# Patient Record
Sex: Female | Born: 1975 | Race: White | Hispanic: No | Marital: Single | Smoking: Current every day smoker
Health system: Southern US, Community
[De-identification: ages and names within clinical notes are randomized; demographics above are authoritative.]

## PROBLEM LIST (undated history)

## (undated) ENCOUNTER — Ambulatory Visit (HOSPITAL_COMMUNITY): Discharge status: Absent without leave | Payer: Medicaid Other

## (undated) DIAGNOSIS — Z789 Other specified health status: Secondary | ICD-10-CM

## (undated) DIAGNOSIS — F419 Anxiety disorder, unspecified: Secondary | ICD-10-CM

## (undated) DIAGNOSIS — T7840XA Allergy, unspecified, initial encounter: Secondary | ICD-10-CM

## (undated) HISTORY — PX: WISDOM TOOTH EXTRACTION: SHX21

## (undated) HISTORY — DX: Allergy, unspecified, initial encounter: T78.40XA

---

## 1998-07-03 ENCOUNTER — Other Ambulatory Visit: Admission: RE | Admit: 1998-07-03 | Discharge: 1998-07-03 | Payer: Self-pay | Admitting: Obstetrics

## 1998-07-05 ENCOUNTER — Ambulatory Visit (HOSPITAL_COMMUNITY): Admission: RE | Admit: 1998-07-05 | Discharge: 1998-07-05 | Payer: Self-pay | Admitting: Obstetrics

## 1998-10-08 ENCOUNTER — Ambulatory Visit (HOSPITAL_COMMUNITY): Admission: RE | Admit: 1998-10-08 | Discharge: 1998-10-08 | Payer: Self-pay | Admitting: Obstetrics

## 1998-11-04 ENCOUNTER — Ambulatory Visit (HOSPITAL_COMMUNITY): Admission: RE | Admit: 1998-11-04 | Discharge: 1998-11-04 | Payer: Self-pay | Admitting: Obstetrics

## 1999-01-26 ENCOUNTER — Inpatient Hospital Stay (HOSPITAL_COMMUNITY): Admission: AD | Admit: 1999-01-26 | Discharge: 1999-01-26 | Payer: Self-pay | Admitting: Obstetrics

## 1999-01-28 ENCOUNTER — Inpatient Hospital Stay (HOSPITAL_COMMUNITY): Admission: AD | Admit: 1999-01-28 | Discharge: 1999-01-28 | Payer: Self-pay | Admitting: Obstetrics

## 1999-01-30 ENCOUNTER — Inpatient Hospital Stay (HOSPITAL_COMMUNITY): Admission: AD | Admit: 1999-01-30 | Discharge: 1999-02-01 | Payer: Self-pay | Admitting: Obstetrics

## 2001-04-23 ENCOUNTER — Emergency Department (HOSPITAL_COMMUNITY): Admission: EM | Admit: 2001-04-23 | Discharge: 2001-04-23 | Payer: Self-pay | Admitting: Emergency Medicine

## 2001-11-03 ENCOUNTER — Emergency Department (HOSPITAL_COMMUNITY): Admission: EM | Admit: 2001-11-03 | Discharge: 2001-11-03 | Payer: Self-pay | Admitting: Emergency Medicine

## 2002-03-10 ENCOUNTER — Emergency Department (HOSPITAL_COMMUNITY): Admission: EM | Admit: 2002-03-10 | Discharge: 2002-03-10 | Payer: Self-pay | Admitting: *Deleted

## 2002-03-28 ENCOUNTER — Encounter: Payer: Self-pay | Admitting: *Deleted

## 2002-03-28 ENCOUNTER — Emergency Department (HOSPITAL_COMMUNITY): Admission: EM | Admit: 2002-03-28 | Discharge: 2002-03-28 | Payer: Self-pay | Admitting: *Deleted

## 2002-07-15 ENCOUNTER — Emergency Department (HOSPITAL_COMMUNITY): Admission: EM | Admit: 2002-07-15 | Discharge: 2002-07-15 | Payer: Self-pay | Admitting: *Deleted

## 2002-09-09 ENCOUNTER — Emergency Department (HOSPITAL_COMMUNITY): Admission: EM | Admit: 2002-09-09 | Discharge: 2002-09-09 | Payer: Self-pay | Admitting: *Deleted

## 2002-09-11 ENCOUNTER — Emergency Department (HOSPITAL_COMMUNITY): Admission: EM | Admit: 2002-09-11 | Discharge: 2002-09-11 | Payer: Self-pay | Admitting: Emergency Medicine

## 2002-11-14 ENCOUNTER — Emergency Department (HOSPITAL_COMMUNITY): Admission: EM | Admit: 2002-11-14 | Discharge: 2002-11-14 | Payer: Self-pay | Admitting: Emergency Medicine

## 2003-04-26 ENCOUNTER — Emergency Department (HOSPITAL_COMMUNITY): Admission: EM | Admit: 2003-04-26 | Discharge: 2003-04-26 | Payer: Self-pay | Admitting: Emergency Medicine

## 2003-05-06 ENCOUNTER — Emergency Department (HOSPITAL_COMMUNITY): Admission: EM | Admit: 2003-05-06 | Discharge: 2003-05-06 | Payer: Self-pay | Admitting: Emergency Medicine

## 2004-06-03 ENCOUNTER — Emergency Department (HOSPITAL_COMMUNITY): Admission: EM | Admit: 2004-06-03 | Discharge: 2004-06-03 | Payer: Self-pay | Admitting: Family Medicine

## 2004-06-12 ENCOUNTER — Emergency Department (HOSPITAL_COMMUNITY): Admission: EM | Admit: 2004-06-12 | Discharge: 2004-06-13 | Payer: Self-pay | Admitting: Emergency Medicine

## 2004-06-15 ENCOUNTER — Emergency Department (HOSPITAL_COMMUNITY): Admission: EM | Admit: 2004-06-15 | Discharge: 2004-06-15 | Payer: Self-pay | Admitting: Family Medicine

## 2004-06-17 ENCOUNTER — Emergency Department (HOSPITAL_COMMUNITY): Admission: EM | Admit: 2004-06-17 | Discharge: 2004-06-17 | Payer: Self-pay

## 2004-07-29 ENCOUNTER — Other Ambulatory Visit: Admission: RE | Admit: 2004-07-29 | Discharge: 2004-07-29 | Payer: Self-pay | Admitting: Internal Medicine

## 2004-07-29 ENCOUNTER — Ambulatory Visit (HOSPITAL_COMMUNITY): Admission: RE | Admit: 2004-07-29 | Discharge: 2004-07-29 | Payer: Self-pay | Admitting: Internal Medicine

## 2004-08-04 ENCOUNTER — Ambulatory Visit (HOSPITAL_COMMUNITY): Admission: RE | Admit: 2004-08-04 | Discharge: 2004-08-04 | Payer: Self-pay | Admitting: Internal Medicine

## 2004-10-08 ENCOUNTER — Ambulatory Visit: Payer: Self-pay | Admitting: Internal Medicine

## 2005-01-19 ENCOUNTER — Ambulatory Visit: Payer: Self-pay | Admitting: Internal Medicine

## 2005-01-21 ENCOUNTER — Ambulatory Visit: Payer: Self-pay | Admitting: Internal Medicine

## 2005-03-22 ENCOUNTER — Ambulatory Visit: Payer: Self-pay | Admitting: Internal Medicine

## 2005-07-27 ENCOUNTER — Inpatient Hospital Stay (HOSPITAL_COMMUNITY): Admission: AD | Admit: 2005-07-27 | Discharge: 2005-07-27 | Payer: Self-pay | Admitting: *Deleted

## 2005-08-09 ENCOUNTER — Ambulatory Visit: Payer: Self-pay | Admitting: Internal Medicine

## 2005-08-09 ENCOUNTER — Other Ambulatory Visit: Admission: RE | Admit: 2005-08-09 | Discharge: 2005-08-09 | Payer: Self-pay | Admitting: Internal Medicine

## 2005-08-09 LAB — CONVERTED CEMR LAB: Pap Smear: NORMAL

## 2005-12-24 ENCOUNTER — Emergency Department (HOSPITAL_COMMUNITY): Admission: EM | Admit: 2005-12-24 | Discharge: 2005-12-24 | Payer: Self-pay | Admitting: Family Medicine

## 2006-02-21 ENCOUNTER — Ambulatory Visit: Payer: Self-pay | Admitting: Internal Medicine

## 2006-03-08 ENCOUNTER — Ambulatory Visit: Payer: Self-pay | Admitting: Internal Medicine

## 2006-03-09 ENCOUNTER — Ambulatory Visit: Payer: Self-pay | Admitting: Internal Medicine

## 2006-04-05 ENCOUNTER — Ambulatory Visit: Payer: Self-pay | Admitting: Internal Medicine

## 2006-06-03 ENCOUNTER — Ambulatory Visit: Payer: Self-pay | Admitting: Family Medicine

## 2006-06-06 ENCOUNTER — Ambulatory Visit: Payer: Self-pay | Admitting: Family Medicine

## 2006-07-01 ENCOUNTER — Ambulatory Visit: Payer: Self-pay | Admitting: Internal Medicine

## 2006-08-09 ENCOUNTER — Other Ambulatory Visit: Admission: RE | Admit: 2006-08-09 | Discharge: 2006-08-09 | Payer: Self-pay | Admitting: Internal Medicine

## 2006-08-09 ENCOUNTER — Encounter: Payer: Self-pay | Admitting: Internal Medicine

## 2006-08-09 ENCOUNTER — Ambulatory Visit: Payer: Self-pay | Admitting: Internal Medicine

## 2006-08-09 LAB — CONVERTED CEMR LAB: Pap Smear: NORMAL

## 2006-11-03 ENCOUNTER — Ambulatory Visit: Payer: Self-pay | Admitting: Family Medicine

## 2007-01-06 ENCOUNTER — Ambulatory Visit: Payer: Self-pay | Admitting: Internal Medicine

## 2007-04-10 ENCOUNTER — Emergency Department (HOSPITAL_COMMUNITY): Admission: EM | Admit: 2007-04-10 | Discharge: 2007-04-10 | Payer: Self-pay | Admitting: Emergency Medicine

## 2007-08-09 ENCOUNTER — Encounter: Payer: Self-pay | Admitting: Internal Medicine

## 2007-08-09 DIAGNOSIS — L2089 Other atopic dermatitis: Secondary | ICD-10-CM

## 2007-08-09 DIAGNOSIS — F329 Major depressive disorder, single episode, unspecified: Secondary | ICD-10-CM

## 2007-08-09 DIAGNOSIS — J309 Allergic rhinitis, unspecified: Secondary | ICD-10-CM | POA: Insufficient documentation

## 2007-08-09 DIAGNOSIS — E669 Obesity, unspecified: Secondary | ICD-10-CM

## 2007-08-09 DIAGNOSIS — F419 Anxiety disorder, unspecified: Secondary | ICD-10-CM | POA: Insufficient documentation

## 2007-08-09 DIAGNOSIS — F17200 Nicotine dependence, unspecified, uncomplicated: Secondary | ICD-10-CM

## 2007-08-09 DIAGNOSIS — D239 Other benign neoplasm of skin, unspecified: Secondary | ICD-10-CM | POA: Insufficient documentation

## 2007-08-09 DIAGNOSIS — F411 Generalized anxiety disorder: Secondary | ICD-10-CM | POA: Insufficient documentation

## 2007-08-22 ENCOUNTER — Other Ambulatory Visit: Admission: RE | Admit: 2007-08-22 | Discharge: 2007-08-22 | Payer: Self-pay | Admitting: Family Medicine

## 2007-08-22 ENCOUNTER — Ambulatory Visit: Payer: Self-pay | Admitting: Family Medicine

## 2007-08-22 ENCOUNTER — Encounter (INDEPENDENT_AMBULATORY_CARE_PROVIDER_SITE_OTHER): Payer: Self-pay | Admitting: Family Medicine

## 2007-08-22 LAB — CONVERTED CEMR LAB
ALT: 9 units/L (ref 0–35)
Alkaline Phosphatase: 59 units/L (ref 39–117)
Basophils Absolute: 0 10*3/uL (ref 0.0–0.1)
CO2: 21 meq/L (ref 19–32)
Eosinophils Absolute: 0.1 10*3/uL (ref 0.0–0.7)
Eosinophils Relative: 1 % (ref 0–5)
HCT: 39.8 % (ref 36.0–46.0)
Hep A Total Ab: NEGATIVE
Hep B Core Total Ab: NEGATIVE
Hep B E Ab: NEGATIVE
Hep B S Ab: NEGATIVE
LDL Cholesterol: 106 mg/dL — ABNORMAL HIGH (ref 0–99)
Lymphocytes Relative: 19 % (ref 12–46)
Neutrophils Relative %: 75 % (ref 43–77)
Platelets: 326 10*3/uL (ref 150–400)
Potassium: 4.1 meq/L (ref 3.5–5.3)
RDW: 13.8 % (ref 11.5–14.0)
Sodium: 141 meq/L (ref 135–145)
Total Bilirubin: 0.3 mg/dL (ref 0.3–1.2)
Total Protein: 7.1 g/dL (ref 6.0–8.3)
VLDL: 23 mg/dL (ref 0–40)

## 2007-10-29 ENCOUNTER — Encounter (INDEPENDENT_AMBULATORY_CARE_PROVIDER_SITE_OTHER): Payer: Self-pay | Admitting: Family Medicine

## 2007-12-05 ENCOUNTER — Ambulatory Visit: Payer: Self-pay | Admitting: Internal Medicine

## 2007-12-07 ENCOUNTER — Ambulatory Visit: Payer: Self-pay | Admitting: Family Medicine

## 2007-12-09 ENCOUNTER — Emergency Department (HOSPITAL_COMMUNITY): Admission: EM | Admit: 2007-12-09 | Discharge: 2007-12-09 | Payer: Self-pay | Admitting: *Deleted

## 2007-12-27 ENCOUNTER — Ambulatory Visit: Payer: Self-pay | Admitting: Internal Medicine

## 2008-02-15 ENCOUNTER — Emergency Department (HOSPITAL_COMMUNITY): Admission: EM | Admit: 2008-02-15 | Discharge: 2008-02-15 | Payer: Self-pay | Admitting: Family Medicine

## 2008-02-17 ENCOUNTER — Emergency Department (HOSPITAL_COMMUNITY): Admission: EM | Admit: 2008-02-17 | Discharge: 2008-02-17 | Payer: Self-pay | Admitting: Emergency Medicine

## 2008-06-23 ENCOUNTER — Emergency Department (HOSPITAL_COMMUNITY): Admission: EM | Admit: 2008-06-23 | Discharge: 2008-06-23 | Payer: Self-pay | Admitting: Emergency Medicine

## 2008-08-14 ENCOUNTER — Emergency Department (HOSPITAL_COMMUNITY): Admission: EM | Admit: 2008-08-14 | Discharge: 2008-08-14 | Payer: Self-pay | Admitting: Emergency Medicine

## 2008-08-18 ENCOUNTER — Emergency Department (HOSPITAL_COMMUNITY): Admission: EM | Admit: 2008-08-18 | Discharge: 2008-08-18 | Payer: Self-pay | Admitting: Emergency Medicine

## 2008-12-10 ENCOUNTER — Inpatient Hospital Stay (HOSPITAL_COMMUNITY): Admission: AD | Admit: 2008-12-10 | Discharge: 2008-12-10 | Payer: Self-pay | Admitting: Obstetrics & Gynecology

## 2009-08-21 ENCOUNTER — Emergency Department (HOSPITAL_COMMUNITY): Admission: EM | Admit: 2009-08-21 | Discharge: 2009-08-21 | Payer: Self-pay | Admitting: Emergency Medicine

## 2010-02-26 ENCOUNTER — Emergency Department (HOSPITAL_COMMUNITY): Admission: EM | Admit: 2010-02-26 | Discharge: 2010-02-26 | Payer: Self-pay | Admitting: Emergency Medicine

## 2010-09-19 ENCOUNTER — Emergency Department (HOSPITAL_COMMUNITY): Admission: EM | Admit: 2010-09-19 | Discharge: 2010-09-19 | Payer: Self-pay | Admitting: Emergency Medicine

## 2010-09-20 ENCOUNTER — Emergency Department (HOSPITAL_COMMUNITY): Admission: EM | Admit: 2010-09-20 | Discharge: 2010-09-20 | Payer: Self-pay | Admitting: Family Medicine

## 2011-09-10 LAB — POCT RAPID STREP A: Streptococcus, Group A Screen (Direct): NEGATIVE

## 2011-09-24 LAB — POCT PREGNANCY, URINE: Preg Test, Ur: NEGATIVE

## 2011-09-24 LAB — WET PREP, GENITAL: Yeast Wet Prep HPF POC: NONE SEEN

## 2011-09-24 LAB — URINALYSIS, ROUTINE W REFLEX MICROSCOPIC
Bilirubin Urine: NEGATIVE
Glucose, UA: NEGATIVE mg/dL
Hgb urine dipstick: NEGATIVE
Ketones, ur: NEGATIVE mg/dL
pH: 7 (ref 5.0–8.0)

## 2011-09-24 LAB — GC/CHLAMYDIA PROBE AMP, GENITAL: Chlamydia, DNA Probe: NEGATIVE

## 2011-10-24 ENCOUNTER — Encounter (HOSPITAL_COMMUNITY): Payer: Self-pay

## 2011-10-24 ENCOUNTER — Emergency Department (HOSPITAL_COMMUNITY)
Admission: EM | Admit: 2011-10-24 | Discharge: 2011-10-24 | Disposition: A | Discharge status: Home / Self Care | Payer: Medicaid Other | Attending: Emergency Medicine | Admitting: Emergency Medicine

## 2011-10-24 ENCOUNTER — Emergency Department (HOSPITAL_COMMUNITY)
Admission: EM | Admit: 2011-10-24 | Discharge: 2011-10-24 | Discharge status: Against Medical Advice | Payer: Medicaid Other | Source: Home / Self Care | Attending: Emergency Medicine | Admitting: Emergency Medicine

## 2011-10-24 ENCOUNTER — Encounter: Payer: Self-pay | Admitting: *Deleted

## 2011-10-24 DIAGNOSIS — N898 Other specified noninflammatory disorders of vagina: Secondary | ICD-10-CM

## 2011-10-24 DIAGNOSIS — R630 Anorexia: Secondary | ICD-10-CM | POA: Insufficient documentation

## 2011-10-24 DIAGNOSIS — N390 Urinary tract infection, site not specified: Secondary | ICD-10-CM

## 2011-10-24 DIAGNOSIS — F17200 Nicotine dependence, unspecified, uncomplicated: Secondary | ICD-10-CM | POA: Insufficient documentation

## 2011-10-24 DIAGNOSIS — R51 Headache: Secondary | ICD-10-CM | POA: Insufficient documentation

## 2011-10-24 DIAGNOSIS — R300 Dysuria: Secondary | ICD-10-CM | POA: Insufficient documentation

## 2011-10-24 LAB — WET PREP, GENITAL
Trich, Wet Prep: NONE SEEN
Yeast Wet Prep HPF POC: NONE SEEN

## 2011-10-24 LAB — URINE MICROSCOPIC-ADD ON

## 2011-10-24 LAB — URINALYSIS, ROUTINE W REFLEX MICROSCOPIC
Bilirubin Urine: NEGATIVE
Glucose, UA: NEGATIVE mg/dL
Ketones, ur: NEGATIVE mg/dL
Specific Gravity, Urine: 1.023 (ref 1.005–1.030)
pH: 6 (ref 5.0–8.0)

## 2011-10-24 LAB — POCT PREGNANCY, URINE: Preg Test, Ur: NEGATIVE

## 2011-10-24 MED ORDER — CEFTRIAXONE SODIUM 250 MG IJ SOLR
INTRAMUSCULAR | Status: AC
  Filled 2011-10-24: qty 250

## 2011-10-24 MED ORDER — CEFTRIAXONE SODIUM 250 MG IJ SOLR
250.0000 mg | Freq: Once | INTRAMUSCULAR | Status: AC
  Administered 2011-10-24: 250 mg via INTRAMUSCULAR

## 2011-10-24 MED ORDER — DOXYCYCLINE HYCLATE 100 MG PO CAPS: 100.0000 mg | ORAL_CAPSULE | Freq: Two times a day (BID) | ORAL | Status: AC

## 2011-10-24 MED ORDER — AZITHROMYCIN 250 MG PO TABS
ORAL_TABLET | ORAL | Status: AC
  Filled 2011-10-24: qty 4

## 2011-10-24 MED ORDER — AZITHROMYCIN 250 MG PO TABS
1000.0000 mg | ORAL_TABLET | Freq: Once | ORAL | Status: AC
  Administered 2011-10-24: 1000 mg via ORAL

## 2011-10-24 NOTE — ED Provider Notes (Signed)
Medical screening examination/treatment/procedure(s) were performed by non-physician practitioner and as supervising physician I was immediately available for consultation/collaboration.   Nickayla Mcinnis, MD 10/24/11 2350 

## 2011-10-24 NOTE — ED Provider Notes (Signed)
History    patient presents complaining of dark urine for the past 2 weeks she also noticed odor and vaginal discharge for the same time period.  She denies burning on urination or urinary frequency. She denies blood per urine.  She does admits to being sexually active and not using protection except for Mirena IUD. She does admits to douching on a regular basis with just quadrant she denies fever, nausea, vomiting, chest pain or shortness of breath, abdominal pain, back pain. She denies rash  CSN: [id number] Arrival date & time: 10/24/2011  1:45 PM   None     Chief Complaint  Patient presents with  . Urinary Tract Infection    pt in with possible uti states ongoing x2 weeks states drk urine, burning on urination, and odor pt denies pain denies n/v/d    (Consider location/radiation/quality/duration/timing/severity/associated sxs/prior treatment) Patient is a 35 y.o. female presenting with urinary tract infection. The history is provided by the patient. No language interpreter was used.  Urinary Tract Infection This is a new problem. The current episode started 1 to 4 weeks ago. The problem occurs constantly. The problem has been unchanged. Associated symptoms include headaches. Pertinent negatives include no abdominal pain, change in bowel habit, chills, nausea or vomiting. The symptoms are aggravated by nothing.    History reviewed. No pertinent past medical history.  History reviewed. No pertinent past surgical history.  History reviewed. No pertinent family history.  History  Substance Use Topics  . Smoking status: Current Everyday Smoker -- 1.0 packs/day  . Smokeless tobacco: Not on file  . Alcohol Use: No    OB History    Grav Para Term Preterm Abortions TAB SAB Ect Mult Living                  Review of Systems  Constitutional: Negative for chills.  Gastrointestinal: Negative for nausea, vomiting, abdominal pain and change in bowel habit.  Neurological: Positive for  headaches.    Allergies  Citalopram hydrobromide  Home Medications  No current outpatient prescriptions on file.  BP 123/97  Pulse 88  Temp(Src) 98.8 F (37.1 C) (Oral)  Resp 18  SpO2 100%  Physical Exam  Constitutional: She appears well-developed and well-nourished.  Neck: Normal range of motion. Neck supple.  Cardiovascular: Normal rate and regular rhythm.   Pulmonary/Chest: Effort normal and breath sounds normal. No respiratory distress. She has no wheezes. She exhibits no tenderness.  Abdominal: Soft. Bowel sounds are normal. She exhibits no distension. There is no tenderness.  Genitourinary: Uterus normal. Vaginal discharge found.       Normal external vaginal appearance. Vaginal wall is soft and nontender. vaginal discharge noted, with odor. Adnexa non palpable bilaterally and nontender. Mirena IUD in place without obvious complication.  NO vaginal bleeding.   Skin: Skin is warm and dry.    ED Course  Procedures (including critical care time)  Labs Reviewed  URINALYSIS, ROUTINE W REFLEX MICROSCOPIC - Abnormal; Notable for the following:    Leukocytes, UA TRACE (*)    All other components within normal limits  POCT PREGNANCY, URINE  URINE MICROSCOPIC-ADD ON  POCT PREGNANCY, URINE  GC/CHLAMYDIA PROBE AMP, GENITAL  WET PREP, GENITAL   No results found.   No diagnosis found.    MDM  10:04 PM Patient's UA shows no evidence of urinary tract infection. However, pt does c/o of dark urine and occasional dysurea.  Will prescribed doxycycline.  Culture and sensitivity was sent to wet prep is only  significant for numerous amount of white blood cells. We'll treat for possible sexually-transmitted disease, secondary to unprotected sex. GC and chlamydia culture sent.  No evidence of complicated UTI.  VSS, will discharge.         Fayrene Helper, PA Resident 10/24/11 [phone]

## 2011-10-24 NOTE — ED Provider Notes (Deleted)
History     CSN: [id number] Arrival date & time: 10/24/2011 11:16 AM   First MD Initiated Contact with Patient 10/24/11 1312      No chief complaint on file.   (Consider location/radiation/quality/duration/timing/severity/associated sxs/prior treatment) The history is provided by the patient.    History reviewed. No pertinent past medical history.  History reviewed. No pertinent past surgical history.  History reviewed. No pertinent family history.  History  Substance Use Topics  . Smoking status: Current Everyday Smoker -- 1.0 packs/day  . Smokeless tobacco: Not on file  . Alcohol Use: No    OB History    Grav Para Term Preterm Abortions TAB SAB Ect Mult Living                  Review of Systems  Allergies  Citalopram hydrobromide  Home Medications  No current outpatient prescriptions on file.  BP 129/77  Pulse 87  Temp(Src) 97.7 F (36.5 C) (Oral)  SpO2 98%  Physical Exam  ED Course  Procedures (including critical care time)  Labs Reviewed - No data to display No results found.   No diagnosis found.    MDM  Patient is seen and examined, initial history and physical is completed. Evaluation initiated    This patient was not seen by myself. She was not in the room. When I checked on her.      Haeden Hudock A. Patrica Duel, MD 10/24/11 1733

## 2011-10-24 NOTE — ED Notes (Signed)
Pt reprots having UTI, reports "something wrong when she urinates" but denies pain. Having decrease in appetite, no distress noted at triage.

## 2011-10-25 LAB — GC/CHLAMYDIA PROBE AMP, GENITAL: Chlamydia, DNA Probe: NEGATIVE

## 2011-10-26 LAB — URINE CULTURE: Culture  Setup Time: 201211050850

## 2012-04-06 ENCOUNTER — Emergency Department (HOSPITAL_COMMUNITY)
Admission: EM | Admit: 2012-04-06 | Discharge: 2012-04-07 | Disposition: A | Discharge status: Home / Self Care | Payer: Medicaid Other | Attending: Emergency Medicine | Admitting: Emergency Medicine

## 2012-04-06 ENCOUNTER — Encounter (HOSPITAL_COMMUNITY): Payer: Self-pay | Admitting: *Deleted

## 2012-04-06 DIAGNOSIS — R197 Diarrhea, unspecified: Secondary | ICD-10-CM | POA: Insufficient documentation

## 2012-04-06 DIAGNOSIS — R5381 Other malaise: Secondary | ICD-10-CM | POA: Insufficient documentation

## 2012-04-06 DIAGNOSIS — R112 Nausea with vomiting, unspecified: Secondary | ICD-10-CM | POA: Insufficient documentation

## 2012-04-06 DIAGNOSIS — R5383 Other fatigue: Secondary | ICD-10-CM | POA: Insufficient documentation

## 2012-04-06 DIAGNOSIS — J40 Bronchitis, not specified as acute or chronic: Secondary | ICD-10-CM

## 2012-04-06 DIAGNOSIS — F17200 Nicotine dependence, unspecified, uncomplicated: Secondary | ICD-10-CM | POA: Insufficient documentation

## 2012-04-06 NOTE — ED Notes (Signed)
Patient with nausea and vomiting, diarrhea for about two days.

## 2012-04-07 ENCOUNTER — Emergency Department (HOSPITAL_COMMUNITY): Payer: Medicaid Other

## 2012-04-07 LAB — POCT I-STAT, CHEM 8
BUN: 6 mg/dL (ref 6–23)
Chloride: 102 mEq/L (ref 96–112)
Creatinine, Ser: 0.8 mg/dL (ref 0.50–1.10)
Glucose, Bld: 119 mg/dL — ABNORMAL HIGH (ref 70–99)
Potassium: 3.3 mEq/L — ABNORMAL LOW (ref 3.5–5.1)
Sodium: 141 mEq/L (ref 135–145)

## 2012-04-07 LAB — URINALYSIS, ROUTINE W REFLEX MICROSCOPIC
Glucose, UA: NEGATIVE mg/dL
Ketones, ur: NEGATIVE mg/dL
Nitrite: NEGATIVE
Specific Gravity, Urine: 1.024 (ref 1.005–1.030)
pH: 6 (ref 5.0–8.0)

## 2012-04-07 LAB — URINE MICROSCOPIC-ADD ON

## 2012-04-07 MED ORDER — ACETAMINOPHEN-CODEINE 120-12 MG/5ML PO SOLN: 5.0000 mL | Freq: Four times a day (QID) | ORAL | Status: AC | PRN

## 2012-04-07 MED ORDER — ACETAMINOPHEN-CODEINE 120-12 MG/5ML PO SOLN
5.0000 mL | Freq: Once | ORAL | Status: AC
  Administered 2012-04-07: 5 mL via ORAL
  Filled 2012-04-07: qty 10

## 2012-04-07 MED ORDER — LOPERAMIDE HCL 2 MG PO CAPS: 2.0000 mg | ORAL_CAPSULE | Freq: Four times a day (QID) | ORAL | Status: AC | PRN

## 2012-04-07 MED ORDER — ONDANSETRON HCL 4 MG/2ML IJ SOLN
4.0000 mg | Freq: Once | INTRAMUSCULAR | Status: AC
  Administered 2012-04-07: 4 mg via INTRAVENOUS
  Filled 2012-04-07: qty 2

## 2012-04-07 MED ORDER — AZITHROMYCIN 250 MG PO TABS: 500.0000 mg | ORAL_TABLET | Freq: Once | ORAL | Status: AC

## 2012-04-07 MED ORDER — SODIUM CHLORIDE 0.9 % IV BOLUS (SEPSIS)
1000.0000 mL | Freq: Once | INTRAVENOUS | Status: AC
  Administered 2012-04-07: 1000 mL via INTRAVENOUS

## 2012-04-07 NOTE — ED Notes (Signed)
Pt ambulated to restroom without distress.  

## 2012-04-07 NOTE — ED Notes (Signed)
Patient is resting comfortably. 

## 2012-04-07 NOTE — ED Provider Notes (Signed)
Medical screening examination/treatment/procedure(s) were performed by non-physician practitioner and as supervising physician I was immediately available for consultation/collaboration.  Doug Sou, MD 04/07/12 [phone]

## 2012-04-07 NOTE — Discharge Instructions (Signed)
Bronchitis Bronchitis is the body's way of reacting to injury and/or infection (inflammation) of the bronchi. Bronchi are the air tubes that extend from the windpipe into the lungs. If the inflammation becomes severe, it may cause shortness of breath. CAUSES  Inflammation may be caused by:  A virus.   Germs (bacteria).   Dust.   Allergens.   Pollutants and many other irritants.  The cells lining the bronchial tree are covered with tiny hairs (cilia). These constantly beat upward, away from the lungs, toward the mouth. This keeps the lungs free of pollutants. When these cells become too irritated and are unable to do their job, mucus begins to develop. This causes the characteristic cough of bronchitis. The cough clears the lungs when the cilia are unable to do their job. Without either of these protective mechanisms, the mucus would settle in the lungs. Then you would develop pneumonia. Smoking is a common cause of bronchitis and can contribute to pneumonia. Stopping this habit is the single most important thing you can do to help yourself. TREATMENT   Your caregiver may prescribe an antibiotic if the cough is caused by bacteria. Also, medicines that open up your airways make it easier to breathe. Your caregiver may also recommend or prescribe an expectorant. It will loosen the mucus to be coughed up. Only take over-the-counter or prescription medicines for pain, discomfort, or fever as directed by your caregiver.   Removing whatever causes the problem (smoking, for example) is critical to preventing the problem from getting worse.   Cough suppressants may be prescribed for relief of cough symptoms.   Inhaled medicines may be prescribed to help with symptoms now and to help prevent problems from returning.   For those with recurrent (chronic) bronchitis, there may be a need for steroid medicines.  SEEK IMMEDIATE MEDICAL CARE IF:   During treatment, you develop more pus-like mucus  (purulent sputum).   You have a fever.   Your baby is older than 3 months with a rectal temperature of 102 F (38.9 C) or higher.   Your baby is 57 months old or younger with a rectal temperature of 100.4 F (38 C) or higher.   You become progressively more ill.   You have increased difficulty breathing, wheezing, or shortness of breath.  It is necessary to seek immediate medical care if you are elderly or sick from any other disease. MAKE SURE YOU:   Understand these instructions.   Will watch your condition.   Will get help right away if you are not doing well or get worse.  Document Released: 12/06/2005 Document Revised: 11/25/2011 Document Reviewed: 10/15/2008 [facility] Patient Information 2012 [place], [place].Diarrhea Infections caused by germs (bacterial) or a virus commonly cause diarrhea. Your caregiver has determined that with time, rest and fluids, the diarrhea should improve. In general, eat normally while drinking more water than usual. Although water may prevent dehydration, it does not contain salt and minerals (electrolytes). Broths, weak tea without caffeine and oral rehydration solutions (ORS) replace fluids and electrolytes. Small amounts of fluids should be taken frequently. Large amounts at one time may not be tolerated. Plain water may be harmful in infants and the elderly. Oral rehydrating solutions (ORS) are available at pharmacies and grocery stores. ORS replace water and important electrolytes in proper proportions. Sports drinks are not as effective as ORS and may be harmful due to sugars worsening diarrhea.  ORS is especially recommended for use in children with diarrhea. As a general guideline for  children, replace any new fluid losses from diarrhea and/or vomiting with ORS as follows:   If your child weighs 22 pounds or under (10 kg or less), give 60-120 mL ( -  cup or 2 - 4 ounces) of ORS for each episode of diarrheal stool or vomiting episode.   If your  child weighs more than 22 pounds (more than 10 kgs), give 120-240 mL ( - 1 cup or 4 - 8 ounces) of ORS for each diarrheal stool or episode of vomiting.   While correcting for dehydration, children should eat normally. However, foods high in sugar should be avoided because this may worsen diarrhea. Large amounts of carbonated soft drinks, juice, gelatin desserts and other highly sugared drinks should be avoided.   After correction of dehydration, other liquids that are appealing to the child may be added. Children should drink small amounts of fluids frequently and fluids should be increased as tolerated. Children should drink enough fluids to keep urine clear or pale yellow.   Adults should eat normally while drinking more fluids than usual. Drink small amounts of fluids frequently and increase as tolerated. Drink enough fluids to keep urine clear or pale yellow. Broths, weak decaffeinated tea, lemon lime soft drinks (allowed to go flat) and ORS replace fluids and electrolytes.   Avoid:   Carbonated drinks.   Juice.   Extremely hot or cold fluids.   Caffeine drinks.   Fatty, greasy foods.   Alcohol.   Tobacco.   Too much intake of anything at one time.   Gelatin desserts.   Probiotics are active cultures of beneficial bacteria. They may lessen the amount and number of diarrheal stools in adults. Probiotics can be found in yogurt with active cultures and in supplements.   Wash hands well to avoid spreading bacteria and virus.   Anti-diarrheal medications are not recommended for infants and children.   Only take over-the-counter or prescription medicines for pain, discomfort or fever as directed by your caregiver. Do not give aspirin to children because it may cause Reye's Syndrome.   For adults, ask your caregiver if you should continue all prescribed and over-the-counter medicines.   If your caregiver has given you a follow-up appointment, it is very important to keep that  appointment. Not keeping the appointment could result in a chronic or permanent injury, and disability. If there is any problem keeping the appointment, you must call back to this facility for assistance.  SEEK IMMEDIATE MEDICAL CARE IF:   You or your child is unable to keep fluids down or other symptoms or problems become worse in spite of treatment.   Vomiting or diarrhea develops and becomes persistent.   There is vomiting of blood or bile (green material).   There is blood in the stool or the stools are black and tarry.   There is no urine output in 6-8 hours or there is only a small amount of very dark urine.   Abdominal pain develops, increases or localizes.   You have a fever.   Your baby is older than 3 months with a rectal temperature of 102 F (38.9 C) or higher.   Your baby is 26 months old or younger with a rectal temperature of 100.4 F (38 C) or higher.   You or your child develops excessive weakness, dizziness, fainting or extreme thirst.   You or your child develops a rash, stiff neck, severe headache or become irritable or sleepy and difficult to awaken.  MAKE SURE YOU:  Understand these instructions.   Will watch your condition.   Will get help right away if you are not doing well or get worse.  Document Released: 11/26/2002 Document Revised: 11/25/2011 Document Reviewed: 10/13/2009 [facility] Patient Information 2012 [place], [place].

## 2012-04-07 NOTE — ED Provider Notes (Signed)
History     CSN: [id number]  Arrival date & time 04/06/12  2219   First MD Initiated Contact with Patient 04/07/12 [phone]      Chief Complaint  Patient presents with  . Diarrhea    (Consider location/radiation/quality/duration/timing/severity/associated sxs/prior treatment) HPI Comments: Patient here with cough, congestion, sore throat, fever and diarrhea for the past 2 days - states that she has been trying OTC medications without relief - states vomited twice NBNB vomit today.  States that she thinks this is related to gagging on her secretions.  Reports productive cough with green sputum.  States no chest pain or shortness of breath, reports nasal congestion and sore throat.  States no definitive temperature but subjective in that she "feels hot".  No abdominal pain.  Patient is a 36 y.o. female presenting with diarrhea. The history is provided by the patient. No language interpreter was used.  Diarrhea The primary symptoms include fatigue, nausea, vomiting and diarrhea. Primary symptoms do not include fever, weight loss, abdominal pain, melena, hematemesis, jaundice, hematochezia, dysuria, myalgias, arthralgias or rash. The illness began 2 days ago. The onset was gradual. The problem has not changed since onset. The illness does not include chills, anorexia, dysphagia, odynophagia, bloating, constipation, tenesmus, back pain or itching.    History reviewed. No pertinent past medical history.  History reviewed. No pertinent past surgical history.  History reviewed. No pertinent family history.  History  Substance Use Topics  . Smoking status: Current Everyday Smoker -- 1.0 packs/day  . Smokeless tobacco: Not on file  . Alcohol Use: No    OB History    Grav Para Term Preterm Abortions TAB SAB Ect Mult Living                  Review of Systems  Constitutional: Positive for fatigue. Negative for fever, chills and weight loss.  Gastrointestinal: Positive for nausea, vomiting  and diarrhea. Negative for dysphagia, abdominal pain, constipation, melena, hematochezia, bloating, anorexia, hematemesis and jaundice.  Genitourinary: Negative for dysuria.  Musculoskeletal: Negative for myalgias, back pain and arthralgias.  Skin: Negative for itching and rash.  All other systems reviewed and are negative.    Allergies  Citalopram hydrobromide  Home Medications   Current Outpatient Rx  Name Route Sig Dispense Refill  . BISMUTH SUBSALICYLATE 262 MG/15ML PO SUSP Oral Take 15 mLs by mouth every 6 (six) hours as needed. For stomach upset    . LEVONORGESTREL 20 MCG/24HR IU IUD Intrauterine 1 each by Intrauterine route once.      BP 108/70  Pulse 106  Temp(Src) 98.8 F (37.1 C) (Oral)  Resp 18  SpO2 97%  Physical Exam  Nursing note and vitals reviewed. Constitutional: She is oriented to person, place, and time. She appears well-developed and well-nourished. No distress.  HENT:  Head: Normocephalic and atraumatic.  Right Ear: External ear normal.  Left Ear: External ear normal.  Mouth/Throat: Oropharynx is clear and moist. No oropharyngeal exudate.       Boggy nasal mucosa - rhinorrhea  Eyes: Conjunctivae are normal. Pupils are equal, round, and reactive to light. No scleral icterus.  Neck: Normal range of motion. Neck supple.  Cardiovascular: Normal rate, regular rhythm and normal heart sounds.  Exam reveals no gallop and no friction rub.   No murmur heard. Pulmonary/Chest: Effort normal and breath sounds normal. No respiratory distress. She has no wheezes. She has no rales. She exhibits no tenderness.       coughing  Abdominal: Soft. Bowel  sounds are normal. She exhibits no distension and no mass. There is no tenderness. There is no rebound and no guarding.  Musculoskeletal: Normal range of motion. She exhibits no edema and no tenderness.  Lymphadenopathy:    She has no cervical adenopathy.  Neurological: She is alert and oriented to person, place, and time.  No cranial nerve deficit.  Skin: Skin is warm and dry. No rash noted. No erythema. No pallor.  Psychiatric: She has a normal mood and affect. Her behavior is normal. Judgment and thought content normal.    ED Course  Procedures (including critical care time)  Labs Reviewed  URINALYSIS, ROUTINE W REFLEX MICROSCOPIC - Abnormal; Notable for the following:    APPearance CLOUDY (*)    Hgb urine dipstick LARGE (*)    Bilirubin Urine SMALL (*)    Leukocytes, UA SMALL (*)    All other components within normal limits  POCT I-STAT, CHEM 8 - Abnormal; Notable for the following:    Potassium 3.3 (*)    Glucose, Bld 119 (*)    All other components within normal limits  POCT PREGNANCY, URINE  URINE MICROSCOPIC-ADD ON   Dg Chest 2 View  04/07/2012  *RADIOLOGY REPORT*  Clinical Data: Cough, vomiting and diarrhea; fever.  CHEST - 2 VIEW  Comparison: None.  Findings: The lungs are well-aerated.  Vascular congestion is noted, without significant pulmonary edema.  There is no evidence of focal opacification, pleural effusion or pneumothorax.  The heart is normal in size; the mediastinal contour is within normal limits.  No acute osseous abnormalities are seen.  IMPRESSION: Vascular congestion noted, without significant pulmonary edema.  Original Report Authenticated By: Tonia Ghent, M.D.   Results for orders placed during the hospital encounter of 04/06/12  URINALYSIS, ROUTINE W REFLEX MICROSCOPIC      Component Value Range   Color, Urine YELLOW  YELLOW    APPearance CLOUDY (*) CLEAR    Specific Gravity, Urine 1.024  1.005 - 1.030    pH 6.0  5.0 - 8.0    Glucose, UA NEGATIVE  NEGATIVE (mg/dL)   Hgb urine dipstick LARGE (*) NEGATIVE    Bilirubin Urine SMALL (*) NEGATIVE    Ketones, ur NEGATIVE  NEGATIVE (mg/dL)   Protein, ur NEGATIVE  NEGATIVE (mg/dL)   Urobilinogen, UA 1.0  0.0 - 1.0 (mg/dL)   Nitrite NEGATIVE  NEGATIVE    Leukocytes, UA SMALL (*) NEGATIVE   POCT PREGNANCY, URINE       Component Value Range   Preg Test, Ur NEGATIVE  NEGATIVE   POCT I-STAT, CHEM 8      Component Value Range   Sodium 141  135 - 145 (mEq/L)   Potassium 3.3 (*) 3.5 - 5.1 (mEq/L)   Chloride 102  96 - 112 (mEq/L)   BUN 6  6 - 23 (mg/dL)   Creatinine, Ser 1.61  0.50 - 1.10 (mg/dL)   Glucose, Bld 096 (*) 70 - 99 (mg/dL)   Calcium, Ion 0.45  4.09 - 1.32 (mmol/L)   TCO2 25  0 - 100 (mmol/L)   Hemoglobin 13.3  12.0 - 15.0 (g/dL)   HCT 81.1  91.4 - 78.2 (%)  URINE MICROSCOPIC-ADD ON      Component Value Range   Squamous Epithelial / LPF RARE  RARE    WBC, UA 0-2  <3 (WBC/hpf)   RBC / HPF 0-2  <3 (RBC/hpf)   Urine-Other MUCOUS PRESENT     Dg Chest 2 View  04/07/2012  *RADIOLOGY  REPORT*  Clinical Data: Cough, vomiting and diarrhea; fever.  CHEST - 2 VIEW  Comparison: None.  Findings: The lungs are well-aerated.  Vascular congestion is noted, without significant pulmonary edema.  There is no evidence of focal opacification, pleural effusion or pneumothorax.  The heart is normal in size; the mediastinal contour is within normal limits.  No acute osseous abnormalities are seen.  IMPRESSION: Vascular congestion noted, without significant pulmonary edema.  Original Report Authenticated By: Tonia Ghent, M.D.      Bronchitis Diarrhea    MDM  Patient who is a smoker presents with cough and congestion.  She reports diarrhea started when she began taking OTC medications for cough and URI.  Believe this to be related to this.  She reports vomiting only with gagging and as there is no abdominal pain, I do not feel there is a surgical emergency.  Urine with blood but she admits to being on menstrual period.  Patient feels improved after fluids and medication.        Izola Price [place], [place] 04/07/12 [phone]

## 2012-04-07 NOTE — ED Notes (Signed)
Patient transported to X-ray 

## 2012-06-19 ENCOUNTER — Encounter (HOSPITAL_COMMUNITY): Payer: Self-pay | Admitting: *Deleted

## 2012-06-19 DIAGNOSIS — F411 Generalized anxiety disorder: Secondary | ICD-10-CM | POA: Insufficient documentation

## 2012-06-19 DIAGNOSIS — F17200 Nicotine dependence, unspecified, uncomplicated: Secondary | ICD-10-CM | POA: Insufficient documentation

## 2012-06-19 DIAGNOSIS — E669 Obesity, unspecified: Secondary | ICD-10-CM | POA: Insufficient documentation

## 2012-06-19 DIAGNOSIS — R079 Chest pain, unspecified: Secondary | ICD-10-CM | POA: Insufficient documentation

## 2012-06-19 NOTE — ED Notes (Signed)
The pt has had  Mid-chest pain for one week .  No cardiac history she says she is stressed out

## 2012-06-20 ENCOUNTER — Emergency Department (HOSPITAL_COMMUNITY)
Admission: EM | Admit: 2012-06-20 | Discharge: 2012-06-20 | Disposition: A | Discharge status: Home / Self Care | Payer: Medicaid Other | Attending: Emergency Medicine | Admitting: Emergency Medicine

## 2012-06-20 ENCOUNTER — Emergency Department (HOSPITAL_COMMUNITY)
Admit: 2012-06-20 | Discharge: 2012-06-20 | Disposition: A | Discharge status: Home / Self Care | Payer: Medicaid Other | Attending: Emergency Medicine | Admitting: Emergency Medicine

## 2012-06-20 ENCOUNTER — Emergency Department (HOSPITAL_COMMUNITY): Payer: Medicaid Other

## 2012-06-20 DIAGNOSIS — R0789 Other chest pain: Secondary | ICD-10-CM

## 2012-06-20 DIAGNOSIS — Z720 Tobacco use: Secondary | ICD-10-CM

## 2012-06-20 LAB — COMPREHENSIVE METABOLIC PANEL
ALT: 15 U/L (ref 0–35)
AST: 18 U/L (ref 0–37)
Alkaline Phosphatase: 75 U/L (ref 39–117)
CO2: 25 mEq/L (ref 19–32)
Calcium: 9.1 mg/dL (ref 8.4–10.5)
GFR calc Af Amer: >90 mL/min (ref >90)
Glucose, Bld: 103 mg/dL — ABNORMAL HIGH (ref 70–99)
Potassium: 3.4 mEq/L — ABNORMAL LOW (ref 3.5–5.1)
Sodium: 139 mEq/L (ref 135–145)
Total Protein: 7.3 g/dL (ref 6.0–8.3)

## 2012-06-20 LAB — CBC WITH DIFFERENTIAL/PLATELET
Basophils Absolute: 0 10*3/uL (ref 0.0–0.1)
Eosinophils Absolute: 0.2 10*3/uL (ref 0.0–0.7)
Eosinophils Relative: 2 % (ref 0–5)
Lymphocytes Relative: 24 % (ref 12–46)
Lymphs Abs: 2.8 10*3/uL (ref 0.7–4.0)
Neutrophils Relative %: 69 % (ref 43–77)
Platelets: 298 10*3/uL (ref 150–400)
RBC: 4.66 MIL/uL (ref 3.87–5.11)
RDW: 13.5 % (ref 11.5–15.5)
WBC: 11.7 10*3/uL — ABNORMAL HIGH (ref 4.0–10.5)

## 2012-06-20 MED ORDER — POTASSIUM CHLORIDE CRYS ER 20 MEQ PO TBCR
40.0000 meq | EXTENDED_RELEASE_TABLET | Freq: Once | ORAL | Status: AC
  Administered 2012-06-20: 40 meq via ORAL
  Filled 2012-06-20: qty 2

## 2012-06-20 MED ORDER — IOHEXOL 350 MG/ML SOLN
100.0000 mL | Freq: Once | INTRAVENOUS | Status: AC | PRN
  Administered 2012-06-20: 100 mL via INTRAVENOUS

## 2012-06-20 NOTE — ED Notes (Signed)
RETURNED FROM CT SCAN

## 2012-06-20 NOTE — ED Notes (Signed)
Patient transported to X-ray 

## 2012-06-20 NOTE — ED Provider Notes (Signed)
History     CSN: [id number]  Arrival date & time 06/19/12  2344   First MD Initiated Contact with Patient 06/20/12 0047      Chief Complaint  Patient presents with  . Chest Pain    (Consider location/radiation/quality/duration/timing/severity/associated sxs/prior treatment) HPI Complains of left anterior chest pain onset one week ago pain is under left breast nonradiating sharp brought on by emotional stress. No associated shortness of breath nausea or sweatiness. No treatment prior to coming History reviewed. No pertinent past medical history. Past medical history negative History reviewed. No pertinent past surgical history.  No family history on file.  History  Substance Use Topics  . Smoking status: Current Everyday Smoker -- 1.0 packs/day  . Smokeless tobacco: Not on file  . Alcohol Use: No    OB History    Grav Para Term Preterm Abortions TAB SAB Ect Mult Living                  Review of Systems  Constitutional: Negative.   HENT: Negative.   Respiratory: Negative.   Cardiovascular: Positive for chest pain.  Gastrointestinal: Negative.   Genitourinary:       Amenorrheic, has IUD  Musculoskeletal: Negative.   Skin: Negative.   Neurological: Negative.   Hematological: Negative.   Psychiatric/Behavioral: Negative.        Anxiety  All other systems reviewed and are negative.    Allergies  Review of patient's allergies indicates no active allergies.  Home Medications   Current Outpatient Rx  Name Route Sig Dispense Refill  . LEVONORGESTREL 20 MCG/24HR IU IUD Intrauterine 1 each by Intrauterine route once.    Marland Kitchen NAPROXEN SODIUM 220 MG PO TABS Oral Take 220 mg by mouth 2 (two) times daily as needed. For pain      BP 154/97  Pulse 86  Temp 98.6 F (37 C) (Oral)  Resp 18  SpO2 97%  Physical Exam  Nursing note and vitals reviewed. Constitutional: She appears well-developed and well-nourished.  HENT:  Head: Normocephalic and atraumatic.  Eyes:  Conjunctivae are normal. Pupils are equal, round, and reactive to light.  Neck: Neck supple. No tracheal deviation present. No thyromegaly present.  Cardiovascular: Normal rate and regular rhythm.   No murmur heard. Pulmonary/Chest: Effort normal and breath sounds normal.       Tender at left chest wall under breast, reproducing pain  Abdominal: Soft. Bowel sounds are normal. She exhibits no distension. There is no tenderness.       Obese  Musculoskeletal: Normal range of motion. She exhibits no edema and no tenderness.  Neurological: She is alert. Coordination normal.  Skin: Skin is warm and dry. No rash noted.  Psychiatric: She has a normal mood and affect.    ED Course  Procedures (including critical care time)  Date: 06/20/2012  Rate: 90  Rhythm: normal sinus rhythm  QRS Axis: normal  Intervals: normal  ST/T Wave abnormalities: normal  Conduction Disutrbances: none  Narrative Interpretation: unremarkable  No change over 09/09/2002 interpreted   by me  345 a.m. patient resting comfortably no distress. Asymptomatic  Labs Reviewed  CBC WITH DIFFERENTIAL  COMPREHENSIVE METABOLIC PANEL  TROPONIN I   Dg Chest 2 View  06/20/2012  *RADIOLOGY REPORT*  Clinical Data: Chest pain.  CHEST - 2 VIEW  Comparison: 04/07/2012  Findings: Heart and mediastinal contours are within normal limits. No focal opacities or effusions.  No acute bony abnormality.  IMPRESSION: No active cardiopulmonary disease.  Original Report Authenticated  By: Cyndie Chime, M.D.   Chest xray reviewed by me  No diagnosis found.   Results for orders placed during the hospital encounter of 06/20/12  CBC WITH DIFFERENTIAL      Component Value Range   WBC 11.7 (*) 4.0 - 10.5 K/uL   RBC 4.66  3.87 - 5.11 MIL/uL   Hemoglobin 13.9  12.0 - 15.0 g/dL   HCT 16.1  09.6 - 04.5 %   MCV 86.9  78.0 - 100.0 fL   MCH 29.8  26.0 - 34.0 pg   MCHC 34.3  30.0 - 36.0 g/dL   RDW 40.9  81.1 - 91.4 %   Platelets 298  150 - 400  K/uL   Neutrophils Relative 69  43 - 77 %   Neutro Abs 8.1 (*) 1.7 - 7.7 K/uL   Lymphocytes Relative 24  12 - 46 %   Lymphs Abs 2.8  0.7 - 4.0 K/uL   Monocytes Relative 5  3 - 12 %   Monocytes Absolute 0.6  0.1 - 1.0 K/uL   Eosinophils Relative 2  0 - 5 %   Eosinophils Absolute 0.2  0.0 - 0.7 K/uL   Basophils Relative 0  0 - 1 %   Basophils Absolute 0.0  0.0 - 0.1 K/uL  COMPREHENSIVE METABOLIC PANEL      Component Value Range   Sodium 139  135 - 145 mEq/L   Potassium 3.4 (*) 3.5 - 5.1 mEq/L   Chloride 102  96 - 112 mEq/L   CO2 25  19 - 32 mEq/L   Glucose, Bld 103 (*) 70 - 99 mg/dL   BUN 9  6 - 23 mg/dL   Creatinine, Ser 7.82  0.50 - 1.10 mg/dL   Calcium 9.1  8.4 - 95.6 mg/dL   Total Protein 7.3  6.0 - 8.3 g/dL   Albumin 3.8  3.5 - 5.2 g/dL   AST 18  0 - 37 U/L   ALT 15  0 - 35 U/L   Alkaline Phosphatase 75  39 - 117 U/L   Total Bilirubin 0.3  0.3 - 1.2 mg/dL   GFR calc non Af Amer >90  >90 mL/min   GFR calc Af Amer >90  >90 mL/min  TROPONIN I      Component Value Range   Troponin I <0.30  <0.30 ng/mL  D-DIMER, QUANTITATIVE      Component Value Range   D-Dimer, Quant 1.55 (*) 0.00 - 0.48 ug/mL-FEU   Dg Chest 2 View  06/20/2012  *RADIOLOGY REPORT*  Clinical Data: Chest pain.  CHEST - 2 VIEW  Comparison: 04/07/2012  Findings: Heart and mediastinal contours are within normal limits. No focal opacities or effusions.  No acute bony abnormality.  IMPRESSION: No active cardiopulmonary disease.  Original Report Authenticated By: Cyndie Chime, M.D.   Ct Angio Chest W/cm &/or Wo Cm  06/20/2012  *RADIOLOGY REPORT*  Clinical Data: Left chest pain, shortness of breath.  CT ANGIOGRAPHY CHEST  Technique:  Multidetector CT imaging of the chest using the standard protocol during bolus administration of intravenous contrast. Multiplanar reconstructed images including MIPs were obtained and reviewed to evaluate the vascular anatomy.  Contrast: OMNIPAQUE IOHEXOL 350 MG/ML SOLN  Comparison:  06/20/2012 radiograph  Findings: No pulmonary arterial branch filling defect identified. Normal heart size.  Normal caliber aorta.  No pleural or pericardial effusion.  Tiny hiatal hernia.  No intrathoracic lymphadenopathy.  Limited images through the upper abdomen show no acute finding.  Central airways are patent.  Respiratory motion degrades detailed parenchymal evaluation.  Within this limitation, no focal consolidation. No pneumothorax.  T7 sclerotic vertebral body focus is favored to reflect a bone island.  Otherwise, no acute or aggressive osseous abnormality.  IMPRESSION: Degraded by respiratory motion.  No pulmonary embolism or acute intrathoracic process identified.  Original Report Authenticated By: Waneta Martins, M.D.    MDM  Symptoms highly atypical for acute coronary syndrome in this young female with only risk factor being smoker, normal EKG, negative cardiac marker  Pulmonary embolism and aortic dissection a subcutaneous placed negative CT angio Smoking cessation strongly urged. Spent 5 minutes counseling for smoking cessation Plan followup alphamedical Diagnosis #1 atypical chest pain #2 tobacco abuse #3hypokalemia       Doug Sou, MD 06/20/12 [phone]

## 2012-06-20 NOTE — Discharge Instructions (Signed)
Chest Pain (Nonspecific) Call your Doctor at [facility] to ask to get help to stop smoking. Return if your condition worsens for any reason Chest pain has many causes. Your pain could be caused by something serious, such as a heart attack or a blood clot in the lungs. It could also be caused by something less serious, such as a chest bruise or a virus. Follow up with your doctor. More lab tests or other studies may be needed to find the cause of your pain. Most of the time, nonspecific chest pain will improve within 2 to 3 days of rest and mild pain medicine. HOME CARE  For chest bruises, you may put ice on the sore area for 15 to 20 minutes, 3 to 4 times a day. Do this only if it makes you or your child feel better.   Put ice in a plastic bag.   Place a towel between the skin and the bag.   Rest for the next 2 to 3 days.   Go back to work if the pain improves.   See your doctor if the pain lasts longer than 1 to 2 weeks.   Only take medicine as told by your doctor.   Quit smoking if you smoke.  GET HELP RIGHT AWAY IF:   There is more pain or pain that spreads to the arm, neck, jaw, back, or belly (abdomen).   You or your child has shortness of breath.   You or your child coughs more than usual or coughs up blood.   You or your child has very bad back or belly pain, feels sick to his or her stomach (nauseous), or throws up (vomits).   You or your child has very bad weakness.   You or your child passes out (faints).   You or your child has a temperature by mouth above 102 F (38.9 C), not controlled by medicine.  Any of these problems may be serious and may be an emergency. Do not wait to see if the problems will go away. Get medical help right away. Call your local emergency services 911 in [place] Do not drive yourself to the hospital. MAKE SURE YOU:   Understand these instructions.   Will watch this condition.   Will get help right away if you or your child is not doing  well or gets worse.  Document Released: 05/24/2008 Document Revised: 11/25/2011 Document Reviewed: 05/24/2008 [facility] Patient Information 2012 [place], [place].

## 2012-10-14 ENCOUNTER — Encounter (HOSPITAL_COMMUNITY): Payer: Self-pay | Admitting: Emergency Medicine

## 2012-10-14 ENCOUNTER — Emergency Department (HOSPITAL_COMMUNITY)
Admission: EM | Admit: 2012-10-14 | Discharge: 2012-10-14 | Disposition: A | Discharge status: Home / Self Care | Payer: Medicaid Other | Source: Home / Self Care

## 2012-10-14 DIAGNOSIS — J9801 Acute bronchospasm: Secondary | ICD-10-CM

## 2012-10-14 MED ORDER — ALBUTEROL SULFATE (5 MG/ML) 0.5% IN NEBU
INHALATION_SOLUTION | RESPIRATORY_TRACT | Status: AC
  Filled 2012-10-14: qty 0.5

## 2012-10-14 MED ORDER — TRIAMCINOLONE ACETONIDE 40 MG/ML IJ SUSP
INTRAMUSCULAR | Status: AC
  Filled 2012-10-14: qty 5

## 2012-10-14 MED ORDER — IPRATROPIUM BROMIDE 0.02 % IN SOLN
0.5000 mg | Freq: Once | RESPIRATORY_TRACT | Status: AC
  Administered 2012-10-14: 0.5 mg via RESPIRATORY_TRACT

## 2012-10-14 MED ORDER — METHYLPREDNISOLONE 4 MG PO KIT: PACK | ORAL | Status: DC

## 2012-10-14 MED ORDER — ALBUTEROL SULFATE (5 MG/ML) 0.5% IN NEBU
2.5000 mg | INHALATION_SOLUTION | Freq: Once | RESPIRATORY_TRACT | Status: AC
  Administered 2012-10-14: 2.5 mg via RESPIRATORY_TRACT

## 2012-10-14 MED ORDER — ALBUTEROL SULFATE HFA 108 (90 BASE) MCG/ACT IN AERS: 1.0000 | INHALATION_SPRAY | Freq: Four times a day (QID) | RESPIRATORY_TRACT | Status: DC | PRN

## 2012-10-14 MED ORDER — TRIAMCINOLONE ACETONIDE 40 MG/ML IJ SUSP
40.0000 mg | Freq: Once | INTRAMUSCULAR | Status: AC
  Administered 2012-10-14: 40 mg via INTRAMUSCULAR

## 2012-10-14 NOTE — ED Provider Notes (Signed)
History     CSN: [id number]  Arrival date & time 10/14/12  1801   None     Chief Complaint  Patient presents with  . Cough    (Consider location/radiation/quality/duration/timing/severity/associated sxs/prior treatment) HPI Comments: 36 year old morbidly obese smoker yesterday with a cough. She's had this cough for at least 24 hours. She's been taking an old prescription of Tylenol with codeine which has not helped her cough. She has had an episode of post tussive emesis. She denies headache, sore throat,fever, earache, or abdominal pain.she has no known history of asthma or COPD but she does have a history of allergies.  Patient is a 36 y.o. female presenting with cough.  Cough Associated symptoms include sore throat. Pertinent negatives include no chills and no rhinorrhea.    History reviewed. No pertinent past medical history.  History reviewed. No pertinent past surgical history.  History reviewed. No pertinent family history.  History  Substance Use Topics  . Smoking status: Current Every Day Smoker -- 1.0 packs/day  . Smokeless tobacco: Not on file  . Alcohol Use: No    OB History    Grav Para Term Preterm Abortions TAB SAB Ect Mult Living                  Review of Systems  Constitutional: Negative for fever, chills, activity change, appetite change and fatigue.  HENT: Positive for sore throat, voice change and postnasal drip. Negative for congestion, facial swelling, rhinorrhea, neck pain, neck stiffness and sinus pressure.   Eyes: Negative.   Respiratory: Positive for cough.   Cardiovascular: Negative.   Gastrointestinal: Negative.   Genitourinary: Negative.   Musculoskeletal: Negative.   Skin: Negative for pallor and rash.  Neurological: Negative.   Psychiatric/Behavioral: Negative.     Allergies  Review of patient's allergies indicates no known allergies.  Home Medications   Current Outpatient Rx  Name Route Sig Dispense Refill  . ALBUTEROL  SULFATE HFA 108 (90 BASE) MCG/ACT IN AERS Inhalation Inhale 1-2 puffs into the lungs every 6 (six) hours as needed for wheezing. 1 Inhaler 0  . LEVONORGESTREL 20 MCG/24HR IU IUD Intrauterine 1 each by Intrauterine route once.    . METHYLPREDNISOLONE 4 MG PO KIT  As directed 21 tablet 0  . NAPROXEN SODIUM 220 MG PO TABS Oral Take 220 mg by mouth 2 (two) times daily as needed. For pain      BP 120/86  Pulse 91  Temp 98.2 F (36.8 C) (Oral)  SpO2 98%  Physical Exam  Constitutional: She is oriented to person, place, and time. She appears well-developed and well-nourished. No distress.  HENT:  Right Ear: External ear normal.  Left Ear: External ear normal.       TMs pearly gray, transparent, no effusion or bulging or retraction. Oropharynx is mildly injected but without exudates.  Eyes: Conjunctivae normal and EOM are normal. Left eye exhibits no discharge.  Neck: Normal range of motion. Neck supple.  Cardiovascular: Normal rate and regular rhythm.   Pulmonary/Chest: Effort normal. No respiratory distress. She has wheezes. She has no rales.  Musculoskeletal: Normal range of motion. She exhibits no edema.  Lymphadenopathy:    She has no cervical adenopathy.  Neurological: She is alert and oriented to person, place, and time.  Skin: Skin is warm and dry. No rash noted.  Psychiatric: She has a normal mood and affect.    ED Course  Procedures (including critical care time)  Labs Reviewed - No data to display  No results found.   1. Bronchospasm   2. Cough due to bronchospasm       MDM  Post neb her lungs sound much better with fewer wheezes and much less coughing. He states she also feels better She is to take Claritin 10 mg daily With a prescription for albuterol HFA 2 puffs every 4 hours when necessary cough and wheeze Medrol Dosepak as directed While here she received Kenalog 40 mg IM. Recommend stop smoking        Hayden Rasmussen, NP 10/14/12 2009

## 2012-10-14 NOTE — ED Provider Notes (Signed)
Medical screening examination/treatment/procedure(s) were performed by non-physician practitioner and as supervising physician I was immediately available for consultation/collaboration.  Raynald Blend, MD 10/14/12 2013

## 2012-10-14 NOTE — ED Notes (Signed)
Reports she has been having nonproductive coughing since yesterday that is making her chest, throat, and head hurt.  Tried taking Acetamin-Cod 120/12 mg/3mL but no relief.

## 2013-02-10 ENCOUNTER — Encounter (HOSPITAL_COMMUNITY): Payer: Self-pay | Admitting: *Deleted

## 2013-02-10 ENCOUNTER — Emergency Department (HOSPITAL_COMMUNITY)
Admission: EM | Admit: 2013-02-10 | Discharge: 2013-02-11 | Disposition: A | Discharge status: Home / Self Care | Payer: Medicaid Other | Attending: Emergency Medicine | Admitting: Emergency Medicine

## 2013-02-10 DIAGNOSIS — R110 Nausea: Secondary | ICD-10-CM | POA: Insufficient documentation

## 2013-02-10 DIAGNOSIS — R509 Fever, unspecified: Secondary | ICD-10-CM | POA: Insufficient documentation

## 2013-02-10 DIAGNOSIS — F17200 Nicotine dependence, unspecified, uncomplicated: Secondary | ICD-10-CM | POA: Insufficient documentation

## 2013-02-10 DIAGNOSIS — R42 Dizziness and giddiness: Secondary | ICD-10-CM | POA: Insufficient documentation

## 2013-02-10 DIAGNOSIS — R197 Diarrhea, unspecified: Secondary | ICD-10-CM | POA: Insufficient documentation

## 2013-02-10 DIAGNOSIS — Z79899 Other long term (current) drug therapy: Secondary | ICD-10-CM | POA: Insufficient documentation

## 2013-02-10 DIAGNOSIS — Z3202 Encounter for pregnancy test, result negative: Secondary | ICD-10-CM | POA: Insufficient documentation

## 2013-02-10 DIAGNOSIS — R51 Headache: Secondary | ICD-10-CM | POA: Insufficient documentation

## 2013-02-10 LAB — CBC WITH DIFFERENTIAL/PLATELET
Basophils Relative: 0 % (ref 0–1)
Eosinophils Absolute: 0.2 10*3/uL (ref 0.0–0.7)
Eosinophils Relative: 2 % (ref 0–5)
HCT: 38.3 % (ref 36.0–46.0)
Hemoglobin: 13.2 g/dL (ref 12.0–15.0)
Lymphs Abs: 2.5 10*3/uL (ref 0.7–4.0)
MCH: 29.7 pg (ref 26.0–34.0)
MCHC: 34.5 g/dL (ref 30.0–36.0)
MCV: 86.3 fL (ref 78.0–100.0)
Monocytes Absolute: 0.6 10*3/uL (ref 0.1–1.0)
Monocytes Relative: 5 % (ref 3–12)
Neutrophils Relative %: 72 % (ref 43–77)
RBC: 4.44 MIL/uL (ref 3.87–5.11)

## 2013-02-10 LAB — PREGNANCY, URINE: Preg Test, Ur: NEGATIVE

## 2013-02-10 LAB — BASIC METABOLIC PANEL
BUN: 10 mg/dL (ref 6–23)
CO2: 28 mEq/L (ref 19–32)
GFR calc non Af Amer: >90 mL/min (ref >90)
Glucose, Bld: 98 mg/dL (ref 70–99)
Potassium: 3.4 mEq/L — ABNORMAL LOW (ref 3.5–5.1)
Sodium: 137 mEq/L (ref 135–145)

## 2013-02-10 LAB — URINALYSIS, ROUTINE W REFLEX MICROSCOPIC
Bilirubin Urine: NEGATIVE
Glucose, UA: NEGATIVE mg/dL
Protein, ur: NEGATIVE mg/dL
Urobilinogen, UA: 0.2 mg/dL (ref 0.0–1.0)

## 2013-02-10 LAB — URINE MICROSCOPIC-ADD ON

## 2013-02-10 MED ORDER — METOCLOPRAMIDE HCL 5 MG/ML IJ SOLN: 10.0000 mg | Freq: Once | INTRAMUSCULAR | Status: DC

## 2013-02-10 MED ORDER — KETOROLAC TROMETHAMINE 30 MG/ML IJ SOLN: 30.0000 mg | Freq: Once | INTRAMUSCULAR | Status: DC

## 2013-02-10 MED ORDER — ONDANSETRON 4 MG PO TBDP
8.0000 mg | ORAL_TABLET | Freq: Once | ORAL | Status: AC
  Administered 2013-02-10: 8 mg via ORAL
  Filled 2013-02-10: qty 2

## 2013-02-10 MED ORDER — DIPHENHYDRAMINE HCL 50 MG/ML IJ SOLN: 12.5000 mg | Freq: Once | INTRAMUSCULAR | Status: DC

## 2013-02-10 NOTE — ED Notes (Signed)
Ambulated to bathroom for urine sample.

## 2013-02-10 NOTE — ED Notes (Addendum)
C/o nausea for several days. Reports onset tonight at 1700 sx of: diarrhea, HA, dizziness & fever (subjective, mentions highest of 99.2). "Reports recent exposure to a client with c-diff". Denies pain other than HA. Denies vomiting.

## 2013-02-10 NOTE — ED Notes (Signed)
Is concerned with possible C-diff exposure, pt had 1 episode of runny stool yesterday

## 2013-02-10 NOTE — ED Provider Notes (Signed)
History     CSN: [id number]  Arrival date & time 02/10/13  2006   First MD Initiated Contact with Patient 02/10/13 2220      Chief Complaint  Patient presents with  . Headache  . Dizziness  . Diarrhea  . Fever    (Consider location/radiation/quality/duration/timing/severity/associated sxs/prior treatment) The history is provided by the patient.   37 year old female presents the emergency department with chief complaint of headache and diarrhea.  Patient states that she began having soft stools this morning and then had one episode of watery diarrhea this evening.  She has also had a generalized headache without visual disturbance, weakness, difficulty with speech or deep.  She does not have a history of migraines but does state that her headache is 7/10.  She also states that she had a subjective fever yesterday.  The patient does have a healthcare and was exposed to a patient with C. difficile colitis and is afraid that she has developed a C. difficile.  She denies any recent antibiotic use or personal hospitalization.Denies photophobia, phonophobia, UL throbbing, N/V, visual changes, stiff neck, neck pain, rash, or "thunderclap" onset.      History reviewed. No pertinent past medical history.  History reviewed. No pertinent past surgical history.  History reviewed. No pertinent family history.  History  Substance Use Topics  . Smoking status: Current Every Day Smoker -- 1.00 packs/day  . Smokeless tobacco: Not on file  . Alcohol Use: No    OB History   Grav Para Term Preterm Abortions TAB SAB Ect Mult Living                  Review of Systems Ten systems reviewed and are negative for acute change, except as noted in the HPI.    Allergies  Vicodin  Home Medications   Current Outpatient Rx  Name  Route  Sig  Dispense  Refill  . albuterol (PROVENTIL HFA;VENTOLIN HFA) 108 (90 BASE) MCG/ACT inhaler   Inhalation   Inhale 1-2 puffs into the lungs every 6 (six) hours  as needed for wheezing.   1 Inhaler   0   . ibuprofen (ADVIL,MOTRIN) 200 MG tablet   Oral   Take 400 mg by mouth 2 (two) times daily as needed for pain. FOR PAIN         . levonorgestrel (MIRENA) 20 MCG/24HR IUD   Intrauterine   1 each by Intrauterine route once.           BP 138/76  Pulse 86  Temp(Src) 99.1 F (37.3 C) (Oral)  Resp 18  SpO2 98%  Physical Exam   Physical Exam  Nursing note and vitals reviewed. Constitutional: She is oriented to person, place, and time. She appears well-developed and well-nourished. No distress.  HENT:  Head: Normocephalic and atraumatic.  Eyes: Conjunctivae normal and EOM are normal. Pupils are equal, round, and reactive to light. No scleral icterus.  Neck: Normal range of motion.  Cardiovascular: Normal rate, regular rhythm and normal heart sounds.  Exam reveals no gallop and no friction rub.   No murmur heard. Pulmonary/Chest: Effort normal and breath sounds normal. No respiratory distress.  Abdominal: Soft. Bowel sounds are normal. She exhibits no distension and no mass. There is no tenderness. There is no guarding.  Neurological: She is alert and oriented to person, place, and time.  Speech is clear and goal oriented, follows commands Major Cranial nerves without deficit, no facial droop Normal strength in upper and lower extremities bilaterally  including dorsiflexion and plantar flexion, strong and equal grip strength Sensation normal to light and sharp touch Moves extremities without ataxia, coordination intact Normal finger to nose and rapid alternating movements Neg romberg, no pronator drift Normal gait Normal heel-shin and balance Skin: Skin is warm and dry. She is not diaphoretic.      ED Course  Procedures (including critical care time)  Labs Reviewed  BASIC METABOLIC PANEL - Abnormal; Notable for the following:    Potassium 3.4 (*)    All other components within normal limits  CBC WITH DIFFERENTIAL - Abnormal;  Notable for the following:    WBC 11.7 (*)    Neutro Abs 8.4 (*)    All other components within normal limits  URINALYSIS, ROUTINE W REFLEX MICROSCOPIC - Abnormal; Notable for the following:    APPearance CLOUDY (*)    Hgb urine dipstick SMALL (*)    Leukocytes, UA SMALL (*)    All other components within normal limits  URINE MICROSCOPIC-ADD ON - Abnormal; Notable for the following:    Squamous Epithelial / LPF MANY (*)    Bacteria, UA FEW (*)    All other components within normal limits  CLOSTRIDIUM DIFFICILE BY PCR  PREGNANCY, URINE   No results found.   No diagnosis found.    MDM  11:43 PM BP 138/76  Pulse 86  Temp(Src) 99.1 F (37.3 C) (Oral)  Resp 18  SpO2 98% Patient with history of diarrhea and headache.  She has no neurologic abnormalities.  We'll treat the patient with migraine cocktail and fluids.  He shouldn't has no signs of meningitis such as nuchal rigidity.  UA appears to be contaminated.  She has a slightly elevated white count which is the same as her previous CBCs.  The patient has had no nausea vomiting or diarrhea here at the ED.      12:20 AM Patient fell asleep and awoke without headache, nausea, or abdominal pain. She had only one episode of diarrhea and has had none here in the ED.  I have explained that her risk of having C-diff colitis are very low.  The patient is feeling much better and feels safe to go home.  Vitals are stable, no fever.  No signs of dehydration, tolerating PO fluids > 6 oz.  Lungs are clear.  No focal abdominal pain, no concern for appendicitis, cholecystitis, pancreatitis, ruptured viscus, UTI, kidney stone, or any other abdominal etiology.Pt HA improved while in ED.  Presentation is like pts typical HA and non concerning for [facility], ICH, Meningitis, or temporal arteritis. Pt is afebrile with no focal neuro deficits, nuchal rigidity, or change in vision. Pt is to follow up with PCP to discuss prophylactic medication. Pt verbalizes  understanding and is agreeable with plan to dc.  Supportive therapy indicated with return if symptoms worsen.  Patient counseled.   Arthor Captain, PA-C 02/11/13 [address], PA-C 02/11/13 1023

## 2013-02-10 NOTE — ED Notes (Signed)
Pt is complaining of pain on the top of her head. Pt is experiencing dizziness and blurred vision.

## 2013-02-11 MED ORDER — ONDANSETRON HCL 4 MG PO TABS: 4.0000 mg | ORAL_TABLET | Freq: Three times a day (TID) | ORAL | Status: DC | PRN

## 2013-02-11 MED ORDER — LOPERAMIDE HCL 2 MG PO CAPS: 2.0000 mg | ORAL_CAPSULE | Freq: Four times a day (QID) | ORAL | Status: DC | PRN

## 2013-02-11 NOTE — ED Notes (Signed)
Unable to give stool sample at this time.

## 2013-02-11 NOTE — ED Provider Notes (Signed)
Medical screening examination/treatment/procedure(s) were performed by non-physician practitioner and as supervising physician I was immediately available for consultation/collaboration.   Shamiracle Gorden III, MD 02/11/13 1320 

## 2013-02-11 NOTE — ED Notes (Signed)
When PA went in to assess pt, pt stated she is now not having a headache, before pain migraine cocktail. Per PA hold IV medicines.

## 2013-04-02 ENCOUNTER — Emergency Department (HOSPITAL_COMMUNITY)
Admission: EM | Admit: 2013-04-02 | Discharge: 2013-04-02 | Disposition: A | Discharge status: Home / Self Care | Payer: Medicaid Other | Attending: Emergency Medicine | Admitting: Emergency Medicine

## 2013-04-02 ENCOUNTER — Encounter (HOSPITAL_COMMUNITY): Payer: Self-pay

## 2013-04-02 DIAGNOSIS — S90569A Insect bite (nonvenomous), unspecified ankle, initial encounter: Secondary | ICD-10-CM | POA: Insufficient documentation

## 2013-04-02 DIAGNOSIS — Z79899 Other long term (current) drug therapy: Secondary | ICD-10-CM | POA: Insufficient documentation

## 2013-04-02 DIAGNOSIS — Y929 Unspecified place or not applicable: Secondary | ICD-10-CM | POA: Insufficient documentation

## 2013-04-02 DIAGNOSIS — F17200 Nicotine dependence, unspecified, uncomplicated: Secondary | ICD-10-CM | POA: Insufficient documentation

## 2013-04-02 DIAGNOSIS — W57XXXA Bitten or stung by nonvenomous insect and other nonvenomous arthropods, initial encounter: Secondary | ICD-10-CM

## 2013-04-02 DIAGNOSIS — Y939 Activity, unspecified: Secondary | ICD-10-CM | POA: Insufficient documentation

## 2013-04-02 DIAGNOSIS — R110 Nausea: Secondary | ICD-10-CM | POA: Insufficient documentation

## 2013-04-02 MED ORDER — PROMETHAZINE HCL 25 MG PO TABS: 25.0000 mg | ORAL_TABLET | Freq: Four times a day (QID) | ORAL | Status: DC | PRN

## 2013-04-02 NOTE — ED Provider Notes (Signed)
History    This chart was scribed for non-physician practitioner Junious Silk, PA-C working with Gwyneth Sprout, MD by Gerlean Ren, ED Scribe. This patient was seen in room TR08C/TR08C and the patient's care was started at 5:41 PM.   CSN: [id number]  Arrival date & time 04/02/13  1532   First MD Initiated Contact with Patient 04/02/13 1706      Chief Complaint  Patient presents with  . Insect Bite     The history is provided by the patient. No language interpreter was used.  Julie Choi is a 37 y.o. female who presents to the Emergency Department complaining of an insect bite over the left anterior lower leg from an unseen insect.  Pt first noticed the bite 7 days ago with surrounding redness that has gradually decreased but is unsure if she first noticed it in the morning or later in the day.  Pt reports associated pain when first noticed but states pain has gradually improved and is no longer present.  Pt states that nausea has increased over the past 3 days.  Pt has applied Neosporin daily.  No DM.    History reviewed. No pertinent past medical history.  History reviewed. No pertinent past surgical history.  No family history on file.  History  Substance Use Topics  . Smoking status: Current Every Day Smoker -- 1.00 packs/day  . Smokeless tobacco: Not on file  . Alcohol Use: No    No OB history provided.   Review of Systems  Constitutional: Negative for fever and chills.  Respiratory: Negative for shortness of breath.   Cardiovascular: Negative for chest pain.  Gastrointestinal: Positive for nausea. Negative for vomiting and abdominal pain.  Skin:       Positive insect bite  All other systems reviewed and are negative.    Allergies  Other and Vicodin  Home Medications   Current Outpatient Rx  Name  Route  Sig  Dispense  Refill  . albuterol (PROVENTIL HFA;VENTOLIN HFA) 108 (90 BASE) MCG/ACT inhaler   Inhalation   Inhale 2 puffs into the lungs 2 (two)  times daily as needed for wheezing (for allergies).         Marland Kitchen ibuprofen (ADVIL,MOTRIN) 200 MG tablet   Oral   Take 400 mg by mouth 2 (two) times daily as needed for pain.          Marland Kitchen levonorgestrel (MIRENA) 20 MCG/24HR IUD   Intrauterine   1 each by Intrauterine route once. approx 2 years ago         . phenylephrine (SUDAFED PE) 10 MG TABS   Oral   Take 10 mg by mouth daily as needed (for allergies).           BP 146/80  Pulse 86  Temp(Src) 98.9 F (37.2 C) (Oral)  Resp 18  SpO2 99%  Physical Exam  Nursing note and vitals reviewed. Constitutional: She is oriented to person, place, and time. She appears well-developed and well-nourished. No distress.  HENT:  Head: Normocephalic and atraumatic.  Right Ear: External ear normal.  Left Ear: External ear normal.  Nose: Nose normal.  Mouth/Throat: Oropharynx is clear and moist.  Eyes: Conjunctivae are normal.  Neck: Normal range of motion.  Cardiovascular: Normal rate, regular rhythm and normal heart sounds.   Pulmonary/Chest: Effort normal and breath sounds normal. No stridor. No respiratory distress. She has no wheezes. She has no rales.  Abdominal: Soft. She exhibits no distension. There is no tenderness.  Musculoskeletal: Normal range of motion.  Neurological: She is alert and oriented to person, place, and time. She has normal strength.  Skin: Skin is warm and dry. She is not diaphoretic.  1cm area that appears scabbed over with surrounding mild erythema  Psychiatric: She has a normal mood and affect. Her behavior is normal.    ED Course  Procedures (including critical care time) DIAGNOSTIC STUDIES: Oxygen Saturation is 99% on room air, normal by my interpretation.    COORDINATION OF CARE: 5:46 PM- Informed pt that area does not appear necrotic or infected at this time.  Informed pt to continue applying Neosporin and to keep the area clean.  Discussed strict return precautions.  Discussed nausea medicine here.   Follow-up with PCP.  Pt verbalizes understanding.     1. Insect bite       MDM  Patient presents with insect bite x 7 days. Symptoms are improving. No signs of infection or necrosis. Bite is healing well. Patient is otherwise healthy. Follow up with PCP. Return precautions given. Vital signs stable for discharge. Patient / Family / Caregiver informed of clinical course, understand medical decision-making process, and agree with plan.      I personally performed the services described in this documentation, which was scribed in my presence. The recorded information has been reviewed and is accurate.    Mora Bellman, PA-C 04/03/13 1316

## 2013-04-02 NOTE — ED Notes (Signed)
Pt. Has a bite,. Lt. Lower shin, red and mildly swollen.  Pt. Felt nauseated today.  Denies any pain

## 2013-04-03 NOTE — ED Provider Notes (Signed)
Medical screening examination/treatment/procedure(s) were performed by non-physician practitioner and as supervising physician I was immediately available for consultation/collaboration.   Donavan Kerlin, MD 04/03/13 2202 

## 2013-07-10 ENCOUNTER — Emergency Department (HOSPITAL_COMMUNITY): Payer: Self-pay

## 2013-07-10 ENCOUNTER — Emergency Department (HOSPITAL_COMMUNITY)
Admission: EM | Admit: 2013-07-10 | Discharge: 2013-07-10 | Disposition: A | Discharge status: Home / Self Care | Payer: Self-pay | Attending: Emergency Medicine | Admitting: Emergency Medicine

## 2013-07-10 DIAGNOSIS — Z79899 Other long term (current) drug therapy: Secondary | ICD-10-CM | POA: Insufficient documentation

## 2013-07-10 DIAGNOSIS — J029 Acute pharyngitis, unspecified: Secondary | ICD-10-CM | POA: Insufficient documentation

## 2013-07-10 DIAGNOSIS — N949 Unspecified condition associated with female genital organs and menstrual cycle: Secondary | ICD-10-CM | POA: Insufficient documentation

## 2013-07-10 DIAGNOSIS — J309 Allergic rhinitis, unspecified: Secondary | ICD-10-CM | POA: Insufficient documentation

## 2013-07-10 DIAGNOSIS — F17200 Nicotine dependence, unspecified, uncomplicated: Secondary | ICD-10-CM | POA: Insufficient documentation

## 2013-07-10 DIAGNOSIS — R102 Pelvic and perineal pain: Secondary | ICD-10-CM

## 2013-07-10 DIAGNOSIS — Z3202 Encounter for pregnancy test, result negative: Secondary | ICD-10-CM | POA: Insufficient documentation

## 2013-07-10 DIAGNOSIS — R109 Unspecified abdominal pain: Secondary | ICD-10-CM | POA: Insufficient documentation

## 2013-07-10 LAB — CBC
MCHC: 33.5 g/dL (ref 30.0–36.0)
Platelets: 264 10*3/uL (ref 150–400)
RDW: 13 % (ref 11.5–15.5)
WBC: 13.1 10*3/uL — ABNORMAL HIGH (ref 4.0–10.5)

## 2013-07-10 LAB — URINE MICROSCOPIC-ADD ON

## 2013-07-10 LAB — URINALYSIS, ROUTINE W REFLEX MICROSCOPIC
Bilirubin Urine: NEGATIVE
Glucose, UA: NEGATIVE mg/dL
Ketones, ur: NEGATIVE mg/dL
Protein, ur: NEGATIVE mg/dL
pH: 6 (ref 5.0–8.0)

## 2013-07-10 LAB — HCG, QUANTITATIVE, PREGNANCY: hCG, Beta Chain, Quant, S: <1 m[IU]/mL (ref <5)

## 2013-07-10 LAB — WET PREP, GENITAL: Clue Cells Wet Prep HPF POC: NONE SEEN

## 2013-07-10 LAB — POCT PREGNANCY, URINE: Preg Test, Ur: POSITIVE — AB

## 2013-07-10 MED ORDER — FLUTICASONE PROPIONATE 50 MCG/ACT NA SUSP: 2.0000 | Freq: Every day | NASAL | Status: DC

## 2013-07-10 MED ORDER — CETIRIZINE HCL 10 MG PO TABS: 10.0000 mg | ORAL_TABLET | Freq: Every day | ORAL | Status: DC

## 2013-07-10 NOTE — ED Provider Notes (Signed)
History    CSN: [id number] Arrival date & time 07/10/13  4696  First MD Initiated Contact with Patient 07/10/13 1857     Chief Complaint  Patient presents with  . Nasal Congestion  . Abdominal Cramping   (Consider location/radiation/quality/duration/timing/severity/associated sxs/prior Treatment) The history is provided by the patient and medical records. No language interpreter was used.    Julie Choi is a 37 y.o. female  with no medical Hx presents to the Emergency Department complaining of gradual, persistent, progressively worsening nasal congestion beginning 2-3 weeks ago.  Pt states she completed a z-pack on 06/18/13 which helped some but the congestion returned.  Pt has been sudafed with only some relief and but nasal congestion returns after use.  Pt states she has had nasal congestion her whole life, but feels like it is getting worse.  She has never seen an ENT or tried allergy medication.    Pt is also c/o left sided pelvic cramps which she attributes to her Mirena IUD which was placed 1 year ago.  She states she has not had it checked in 1 year.  Pt states she has the cramps every once in awhile, but last night she had a bunch in a row and then it stopped compeltely.  She has not had any more last night or today.   Pt with 1 sexual partner for 2 years without condom protection.  Pt with a Hx of trichomonas in 2002.    Pt denies fever, chills, headache, neck pain, back pain, chest pain, SOB, nausea, vomiting, diarrhea, weakness, dizziness, syncope, dysuria, hematuria.     No past medical history on file. No past surgical history on file. No family history on file. History  Substance Use Topics  . Smoking status: Current Every Day Smoker -- 1.00 packs/day  . Smokeless tobacco: Not on file  . Alcohol Use: No   OB History   Grav Para Term Preterm Abortions TAB SAB Ect Mult Living                 Review of Systems  Constitutional: Negative for fever, chills,  diaphoresis, appetite change, fatigue and unexpected weight change.  HENT: Positive for congestion, sore throat, rhinorrhea, postnasal drip and sinus pressure. Negative for ear pain, mouth sores, trouble swallowing, neck pain, neck stiffness and ear discharge.   Eyes: Negative for visual disturbance.  Respiratory: Negative for cough, chest tightness, shortness of breath, wheezing and stridor.   Cardiovascular: Negative for chest pain, palpitations and leg swelling.  Gastrointestinal: Negative for nausea, vomiting, abdominal pain, diarrhea, constipation, blood in stool, abdominal distention and rectal pain.  Genitourinary: Positive for pelvic pain. Negative for dysuria, urgency, frequency, hematuria, flank pain and difficulty urinating.  Musculoskeletal: Negative for myalgias, back pain and arthralgias.  Skin: Negative for rash.  Neurological: Negative for syncope, weakness, light-headedness, numbness and headaches.  Hematological: Negative for adenopathy.  Psychiatric/Behavioral: Negative for confusion. The patient is not nervous/anxious.   All other systems reviewed and are negative.    Allergies  Other; Tomato; and Vicodin  Home Medications   Current Outpatient Rx  Name  Route  Sig  Dispense  Refill  . albuterol (PROVENTIL HFA;VENTOLIN HFA) 108 (90 BASE) MCG/ACT inhaler   Inhalation   Inhale 2 puffs into the lungs 2 (two) times daily as needed for wheezing (for allergies).         Marland Kitchen ibuprofen (ADVIL,MOTRIN) 200 MG tablet   Oral   Take 400 mg by mouth every 6 (  six) hours as needed for pain.          Marland Kitchen levonorgestrel (MIRENA) 20 MCG/24HR IUD   Intrauterine   1 each by Intrauterine route once. Placed in April 2012         . phenylephrine (SUDAFED PE) 10 MG TABS   Oral   Take 10 mg by mouth daily as needed (allergies).          . cetirizine (ZYRTEC ALLERGY) 10 MG tablet   Oral   Take 1 tablet (10 mg total) by mouth daily.   30 tablet   1   . fluticasone (FLONASE) 50  MCG/ACT nasal spray   Nasal   Place 2 sprays into the nose daily.   16 g   2    BP 126/82  Pulse 52  Temp(Src) 97.9 F (36.6 C) (Oral)  Resp 12  SpO2 98% Physical Exam  Nursing note and vitals reviewed. Constitutional: She is oriented to person, place, and time. She appears well-developed and well-nourished. No distress.  HENT:  Head: Normocephalic and atraumatic.  Right Ear: Tympanic membrane, external ear and ear canal normal.  Left Ear: Tympanic membrane, external ear and ear canal normal.  Nose: Mucosal edema and rhinorrhea present. No epistaxis. Right sinus exhibits no maxillary sinus tenderness and no frontal sinus tenderness. Left sinus exhibits no maxillary sinus tenderness and no frontal sinus tenderness.  Mouth/Throat: Uvula is midline, oropharynx is clear and moist and mucous membranes are normal. Mucous membranes are not pale and not cyanotic. No oropharyngeal exudate, posterior oropharyngeal edema, posterior oropharyngeal erythema or tonsillar abscesses.  Eyes: Conjunctivae and EOM are normal. Pupils are equal, round, and reactive to light. No scleral icterus.  Neck: Normal range of motion and full passive range of motion without pain. Neck supple.  Cardiovascular: Normal rate, regular rhythm, normal heart sounds and intact distal pulses.   No murmur heard. Pulmonary/Chest: Effort normal and breath sounds normal. No stridor. No respiratory distress. She has no wheezes. She has no rales.  Abdominal: Soft. Normal appearance and bowel sounds are normal. She exhibits no mass. There is no hepatosplenomegaly. There is tenderness in the left lower quadrant. There is no rebound and no CVA tenderness. Hernia confirmed negative in the right inguinal area and confirmed negative in the left inguinal area.    Genitourinary: Uterus normal. Pelvic exam was performed with patient prone. There is no rash, tenderness, lesion or injury on the right labia. There is no rash, tenderness, lesion  or injury on the left labia. Uterus is not deviated, not enlarged, not fixed and not tender. Cervix exhibits no motion tenderness, no discharge and no friability. Right adnexum displays no mass and no tenderness. Left adnexum displays tenderness (mild). Left adnexum displays no mass and no fullness. No erythema or bleeding around the vagina. No foreign body around the vagina. No signs of injury around the vagina. Vaginal discharge (thick, white, moderate) found.  Musculoskeletal: Normal range of motion. She exhibits no edema.  Lymphadenopathy:    She has no cervical adenopathy.       Right: No inguinal adenopathy present.       Left: No inguinal adenopathy present.  Neurological: She is alert and oriented to person, place, and time. She exhibits normal muscle tone. Coordination normal.  Speech is clear and goal oriented Moves extremities without ataxia  Skin: Skin is warm and dry. No rash noted. She is not diaphoretic.  Psychiatric: She has a normal mood and affect.    ED  Course  FOREIGN BODY REMOVAL Date/Time: 07/10/2013 10:00 PM Performed by: Dierdre Forth Authorized by: Dierdre Forth Consent: Verbal consent obtained. Risks and benefits: risks, benefits and alternatives were discussed Consent given by: patient Patient understanding: patient states understanding of the procedure being performed Patient consent: the patient's understanding of the procedure matches consent given Procedure consent: procedure consent matches procedure scheduled Relevant documents: relevant documents present and verified Site marked: the operative site was marked Imaging studies: imaging studies available Required items: required blood products, implants, devices, and special equipment available Patient identity confirmed: verbally with patient and arm band Time out: Immediately prior to procedure a "time out" was called to verify the correct patient, procedure, equipment, support staff and  site/side marked as required. Intake: intrauterine. Patient sedated: no Patient restrained: no Patient cooperative: yes Complexity: simple 1 objects recovered. Objects recovered: Mirena IUD Post-procedure assessment: foreign body removed Patient tolerance: Patient tolerated the procedure well with no immediate complications.   (including critical care time) Labs Reviewed  WET PREP, GENITAL - Abnormal; Notable for the following:    WBC, Wet Prep HPF POC FEW (*)    All other components within normal limits  URINALYSIS, ROUTINE W REFLEX MICROSCOPIC - Abnormal; Notable for the following:    Hgb urine dipstick TRACE (*)    All other components within normal limits  CBC - Abnormal; Notable for the following:    WBC 13.1 (*)    All other components within normal limits  POCT PREGNANCY, URINE - Abnormal; Notable for the following:    Preg Test, Ur POSITIVE (*)    All other components within normal limits  GC/CHLAMYDIA PROBE AMP  HCG, QUANTITATIVE, PREGNANCY  URINE MICROSCOPIC-ADD ON  ABO/RH   US Ob Comp Less 14 Wks  07/10/2013   *RADIOLOGY REPORT*  Clinical Data: Positive urine pregnancy test.  Quantitative beta HCG less than one.  IUD.  OBSTETRIC <14 WK [place] AND TRANSVAGINAL OB US  Technique:  Both transabdominal and transvaginal ultrasound examinations were performed for complete evaluation of the gestation as well as the maternal uterus, adnexal regions, and pelvic cul-de-sac.  Transvaginal technique was performed to assess early pregnancy.  Comparison:  None.  Intrauterine gestational sac:  No intrauterine pregnancy demonstrated. Yolk sac: Not visualized. Embryo: Not visualized. Cardiac Activity: Not visualized.  Maternal uterus/adnexae: The uterus is anteverted.  No myometrial mass lesions demonstrated. Nabothian cysts in the cervix.  Intrauterine device appears to be in place within the endometrial cavity.  No endometrial fluid collections are demonstrated.  The right ovary measures 3.7 x  1.9 x 2.3 cm.  Possible complex cystic structure demonstrated the right ovary suggesting hemorrhagic cyst or corpus luteal cyst.  Peripheral flow is demonstrated.  The left ovary measures 2.9 x 1.3 x 2.6 cm.  No abnormal adnexal masses are demonstrated.  Small amount of free fluid in the cul-de-sac.  IMPRESSION: Intrauterine device in place.  No intrauterine pregnancy demonstrated.  No abnormal adnexal masses.  Findings could represent failed pregnancy, early intrauterine pregnancy too small to see, or occult ectopic pregnancy.  Recommend follow-up with serial quantitative beta HCG levels and / or short-term ultrasound in 10-14 days as clinically indicated.   Original Report Authenticated By: Burman Nieves, M.D.   US Ob Transvaginal  07/10/2013   *RADIOLOGY REPORT*  Clinical Data: Positive urine pregnancy test.  Quantitative beta HCG less than one.  IUD.  OBSTETRIC <14 WK [place] AND TRANSVAGINAL OB US  Technique:  Both transabdominal and transvaginal ultrasound examinations were performed for  complete evaluation of the gestation as well as the maternal uterus, adnexal regions, and pelvic cul-de-sac.  Transvaginal technique was performed to assess early pregnancy.  Comparison:  None.  Intrauterine gestational sac:  No intrauterine pregnancy demonstrated. Yolk sac: Not visualized. Embryo: Not visualized. Cardiac Activity: Not visualized.  Maternal uterus/adnexae: The uterus is anteverted.  No myometrial mass lesions demonstrated. Nabothian cysts in the cervix.  Intrauterine device appears to be in place within the endometrial cavity.  No endometrial fluid collections are demonstrated.  The right ovary measures 3.7 x 1.9 x 2.3 cm.  Possible complex cystic structure demonstrated the right ovary suggesting hemorrhagic cyst or corpus luteal cyst.  Peripheral flow is demonstrated.  The left ovary measures 2.9 x 1.3 x 2.6 cm.  No abnormal adnexal masses are demonstrated.  Small amount of free fluid in the cul-de-sac.   IMPRESSION: Intrauterine device in place.  No intrauterine pregnancy demonstrated.  No abnormal adnexal masses.  Findings could represent failed pregnancy, early intrauterine pregnancy too small to see, or occult ectopic pregnancy.  Recommend follow-up with serial quantitative beta HCG levels and / or short-term ultrasound in 10-14 days as clinically indicated.   Original Report Authenticated By: Burman Nieves, M.D.   1. Allergic rhinitis   2. Pelvic pain     MDM  Patriciaann Clan presents with Hx and PE consistent with allergic rhinitis.  Pt without signs and symptoms of acute sinusitis.  Mild symptoms of clear/yellow nasal discharge/congestion and scratchy throat without cough persistent after azithromycin.  Patient is afebrile.  No concern for acute bacterial rhinosinusitis; likely allergic in nature.  Patient discharged with symptomatic treatment.   Pt also with LLQ pelvic pain.  Pt not concerning for PID because, wet prep with only few WBC's, hemodynamically stable and no cervical motion tenderness on pelvic exam. Pt with initially positive urine pregnancy test, however her hCG quantitative less than 1 and pelvic ultrasound is without evidence of pregnancy.   Patient with history of IUD placement and IUD is seen and in place. Patient requests removal of IUD which was completed without complication.  Patient is nontoxic, nonseptic appearing, in no apparent distress.  Patient's pain and other symptoms adequately managed in emergency department.  Fluid bolus given.  Labs, imaging and vitals reviewed.  Patient does not meet the SIRS or Sepsis criteria.  On repeat exam patient does not have a surgical abdomin and there are no peritoneal signs.  No indication of appendicitis, bowel obstruction, bowel perforation, cholecystitis, diverticulitis, PID or ectopic pregnancy.  Patient discharged home with symptomatic treatment and given strict instructions for follow-up with their OBGYN for repeat quant.  I  have also discussed reasons to return immediately to the ER.  Patient expresses understanding and agrees with plan.       Dahlia Client Debbi Strandberg, PA-C 07/11/13 0009

## 2013-07-10 NOTE — ED Notes (Signed)
Pt c/o nasal congestion. States she finished Zithromax on 7/5, but now congestion is back and she feels worse. Pt also states she has had severe lower L side abdominal cramping. Pt thinks her IUD is causing the cramping. Pt states she is not having cramping now, but worries that the nasal congestion is worse now because of her IUD. Pt with no acute distress. Pt a/o x 4. Pt ambulatory to exam room with steady gait. Pt arrives with companion.

## 2013-07-11 LAB — GC/CHLAMYDIA PROBE AMP
CT Probe RNA: NEGATIVE
GC Probe RNA: NEGATIVE

## 2013-07-12 NOTE — ED Provider Notes (Signed)
  Medical screening examination/treatment/procedure(s) were performed by non-physician practitioner and as supervising physician I was immediately available for consultation/collaboration.    Cardell Rachel, MD 07/12/13 0706 

## 2013-12-12 ENCOUNTER — Encounter (HOSPITAL_COMMUNITY): Payer: Self-pay | Admitting: Emergency Medicine

## 2013-12-12 ENCOUNTER — Emergency Department (HOSPITAL_COMMUNITY)
Admission: EM | Admit: 2013-12-12 | Discharge: 2013-12-13 | Disposition: A | Discharge status: Home / Self Care | Payer: Medicaid Other | Attending: Emergency Medicine | Admitting: Emergency Medicine

## 2013-12-12 DIAGNOSIS — F17200 Nicotine dependence, unspecified, uncomplicated: Secondary | ICD-10-CM | POA: Insufficient documentation

## 2013-12-12 DIAGNOSIS — J329 Chronic sinusitis, unspecified: Secondary | ICD-10-CM

## 2013-12-12 DIAGNOSIS — IMO0001 Reserved for inherently not codable concepts without codable children: Secondary | ICD-10-CM | POA: Insufficient documentation

## 2013-12-12 DIAGNOSIS — Z79899 Other long term (current) drug therapy: Secondary | ICD-10-CM | POA: Insufficient documentation

## 2013-12-12 NOTE — ED Notes (Signed)
Patient presents with c/o sore throat and sinus infection

## 2013-12-13 MED ORDER — PSEUDOEPHEDRINE HCL ER 120 MG PO TB12: 120.0000 mg | ORAL_TABLET | Freq: Two times a day (BID) | ORAL | Status: DC

## 2013-12-13 MED ORDER — PSEUDOEPHEDRINE HCL ER 120 MG PO TB12
120.0000 mg | ORAL_TABLET | Freq: Once | ORAL | Status: AC
  Administered 2013-12-13: 120 mg via ORAL
  Filled 2013-12-13: qty 1

## 2013-12-13 MED ORDER — FLUTICASONE PROPIONATE 50 MCG/ACT NA SUSP: 2.0000 | Freq: Every day | NASAL | Status: DC

## 2013-12-13 NOTE — ED Provider Notes (Signed)
Medical screening examination/treatment/procedure(s) were performed by non-physician practitioner and as supervising physician I was immediately available for consultation/collaboration.    Olivia Mackie, MD 12/13/13 [phone]

## 2013-12-13 NOTE — ED Provider Notes (Signed)
CSN: [id number]     Arrival date & time 12/12/13  2159 History   First MD Initiated Contact with Patient 12/12/13 2357     Chief Complaint  Patient presents with  . Recurrent Sinusitis   (Consider location/radiation/quality/duration/timing/severity/associated sxs/prior Treatment) HPI Comments: Patient states, that for the last 3, weeks.  She's had nasal congestion.  That has gotten worse over the past 24 hours.  She has not taken any medication.  For congestion, but states she just feels, worse denies fever, nausea, blurry vision, ear pain.  He does, state that she has an occasional cough, and will bring up some thick mucus  The history is provided by the patient.    History reviewed. No pertinent past medical history. Past Surgical History  Procedure Laterality Date  . Wisdom tooth extraction     History reviewed. No pertinent family history. History  Substance Use Topics  . Smoking status: Current Every Day Smoker -- 1.00 packs/day  . Smokeless tobacco: Not on file  . Alcohol Use: Yes     Comment: ocassionally   OB History   Grav Para Term Preterm Abortions TAB SAB Ect Mult Living                 Review of Systems  Constitutional: Negative for fever and chills.  HENT: Positive for congestion. Negative for rhinorrhea and sore throat.   Eyes: Negative for visual disturbance.  Respiratory: Positive for cough. Negative for shortness of breath.   Gastrointestinal: Negative for nausea.  Musculoskeletal: Positive for myalgias.  All other systems reviewed and are negative.    Allergies  Other; Tomato; and Vicodin  Home Medications   Current Outpatient Rx  Name  Route  Sig  Dispense  Refill  . albuterol (PROVENTIL HFA;VENTOLIN HFA) 108 (90 BASE) MCG/ACT inhaler   Inhalation   Inhale 2 puffs into the lungs 2 (two) times daily as needed for wheezing (for allergies).         . cetirizine (ZYRTEC ALLERGY) 10 MG tablet   Oral   Take 1 tablet (10 mg total) by mouth  daily.   30 tablet   1   . fluticasone (FLONASE) 50 MCG/ACT nasal spray   Each Nare   Place 2 sprays into both nostrils daily.   16 g   0   . ibuprofen (ADVIL,MOTRIN) 200 MG tablet   Oral   Take 400 mg by mouth every 6 (six) hours as needed for pain.          Marland Kitchen levonorgestrel (MIRENA) 20 MCG/24HR IUD   Intrauterine   1 each by Intrauterine route once. Placed in April 2012         . phenylephrine (SUDAFED PE) 10 MG TABS   Oral   Take 10 mg by mouth daily as needed (allergies).          . pseudoephedrine (SUDAFED) 120 MG 12 hr tablet   Oral   Take 1 tablet (120 mg total) by mouth 2 (two) times daily.   10 tablet   0    BP 153/97  Pulse 87  Temp(Src) 99.1 F (37.3 C) (Oral)  Resp 20  Ht 5\' 2"  (1.575 m)  Wt 246 lb 6.4 oz (111.766 kg)  BMI 45.06 kg/m2  SpO2 97%  LMP 12/08/2013 Physical Exam  Nursing note and vitals reviewed. Constitutional: She appears well-developed and well-nourished.  Morbidly obese, in no acute distress no facial swelling, or fullness noted  HENT:  Head: Normocephalic and atraumatic.  Nose: Right sinus exhibits maxillary sinus tenderness and frontal sinus tenderness. Left sinus exhibits maxillary sinus tenderness and frontal sinus tenderness.  Mouth/Throat: Oropharynx is clear and moist. No oropharyngeal exudate.  Minimal tenderness over the sinuses.  No facial fullness, in appearance  Eyes: Pupils are equal, round, and reactive to light.  Neck: Normal range of motion.  Cardiovascular: Normal rate and regular rhythm.   Pulmonary/Chest: Effort normal. She has no wheezes.  Musculoskeletal: Normal range of motion.  Neurological: She is alert.  Skin: Skin is warm and dry. No rash noted. No erythema.    ED Course  Procedures (including critical care time) Labs Review Labs Reviewed - No data to display Imaging Review No results found.  EKG Interpretation   None       MDM   1. Sinusitis         Arman Filter, NP 12/13/13  [phone]

## 2013-12-24 IMAGING — US US OB TRANSVAGINAL
1 series · 13 of 28 positions shown · non-contrast
Comparison: None.

CLINICAL DATA: Positive urine pregnancy test.  Quantitative beta
HCG less than one.  IUD.

OBSTETRIC <14 WK US AND TRANSVAGINAL OB US
TECHNIQUE: Both transabdominal and transvaginal ultrasound
examinations were performed for complete evaluation of the
gestation as well as the maternal uterus, adnexal regions, and
pelvic cul-de-sac.  Transvaginal technique was performed to assess
early pregnancy.

[Series 1: us ob transvaginal · 0.30mm/px · 13 of 39 slices shown]
[im 2/39]
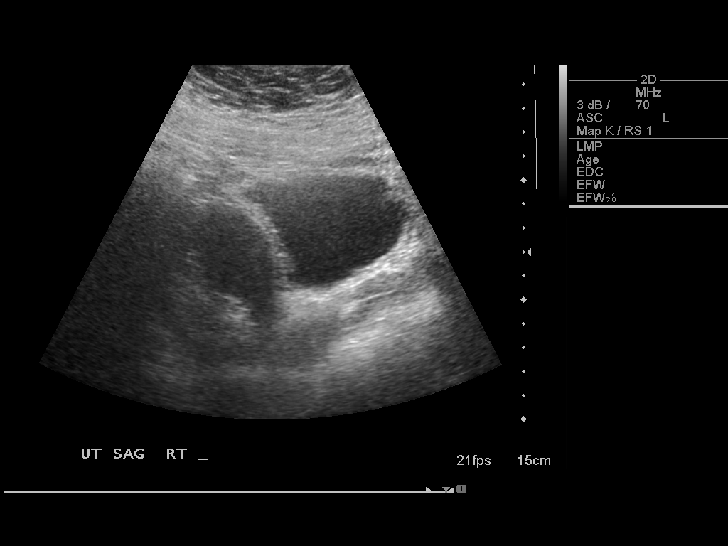
[im 5/39]
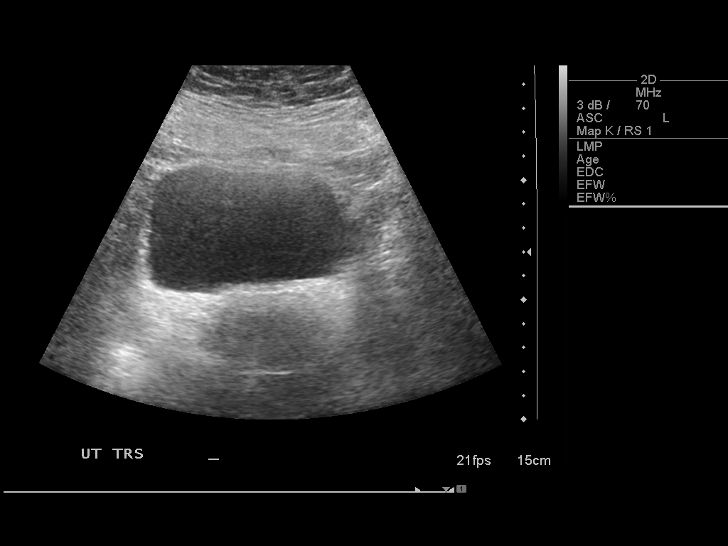
[im 8/39]
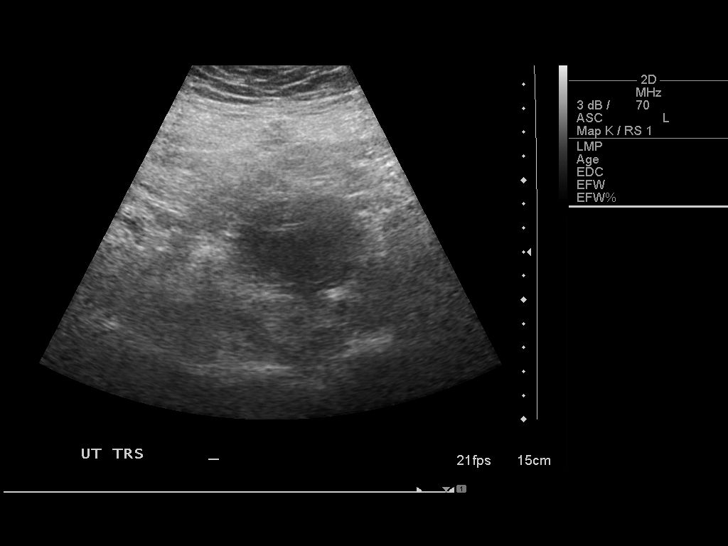
[im 10/39]
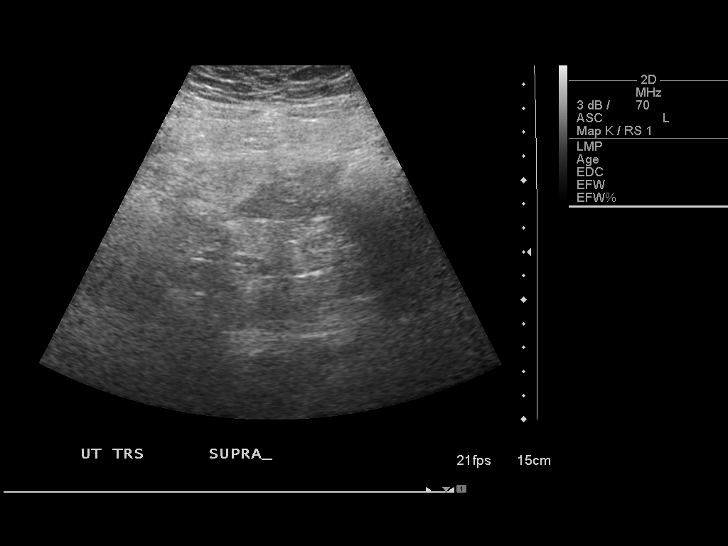
[im 13/39]
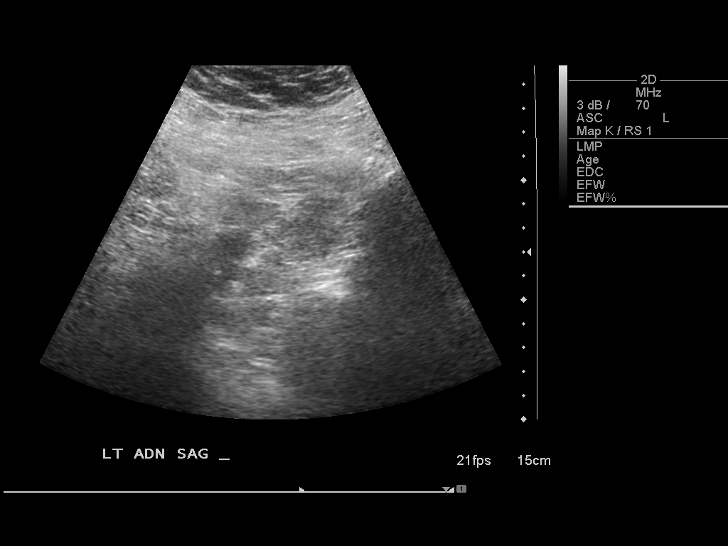
[im 16/39]
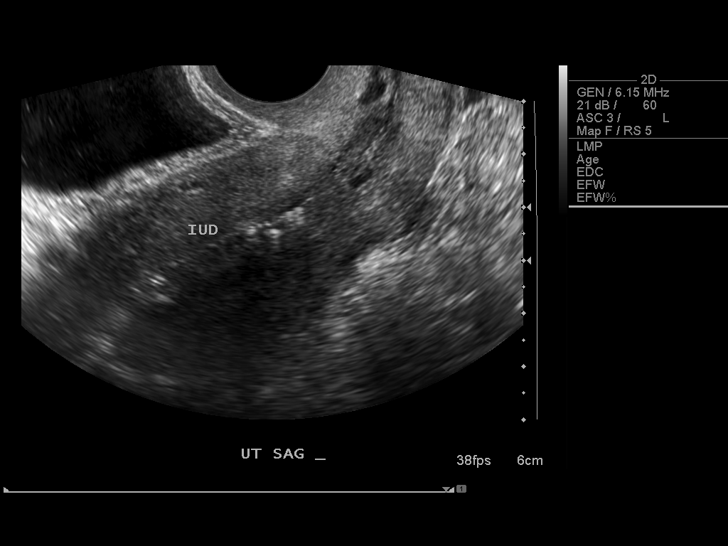
[im 20/39]
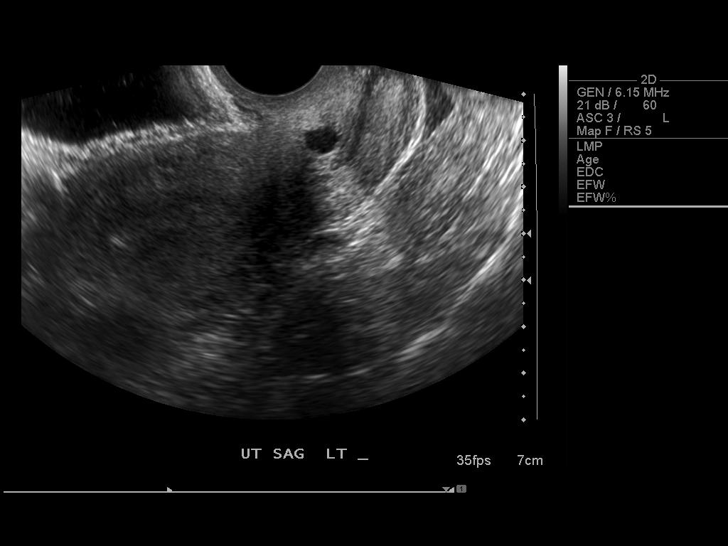
[im 23/39]
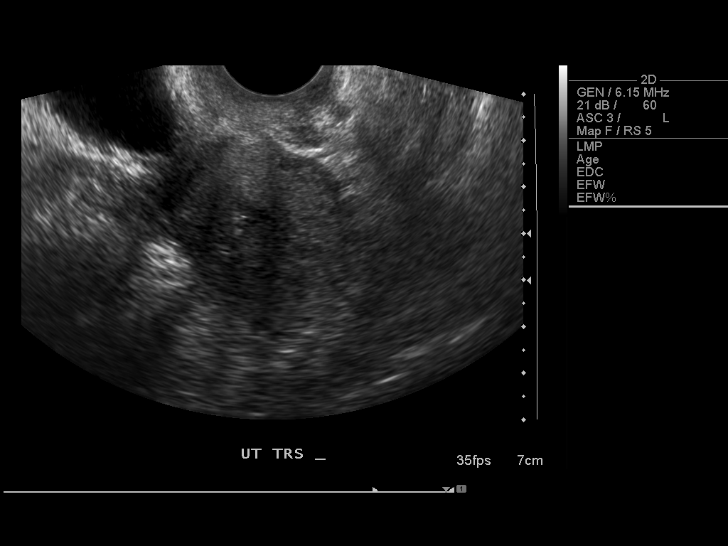
[im 26/39]
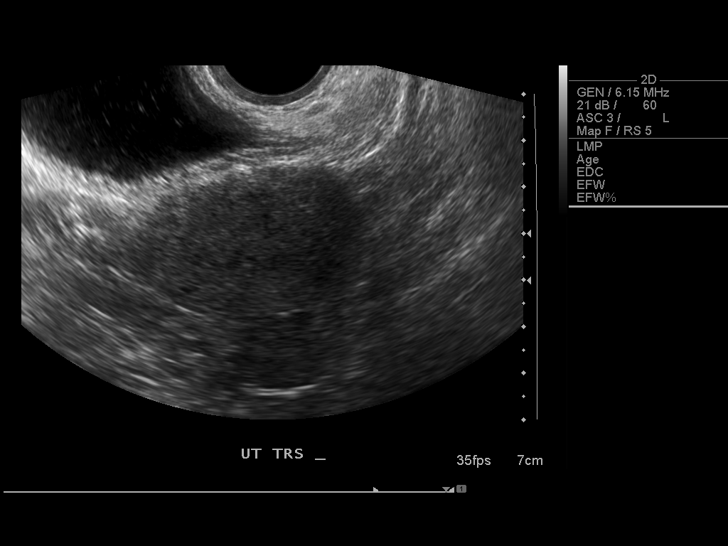
[im 29/39]
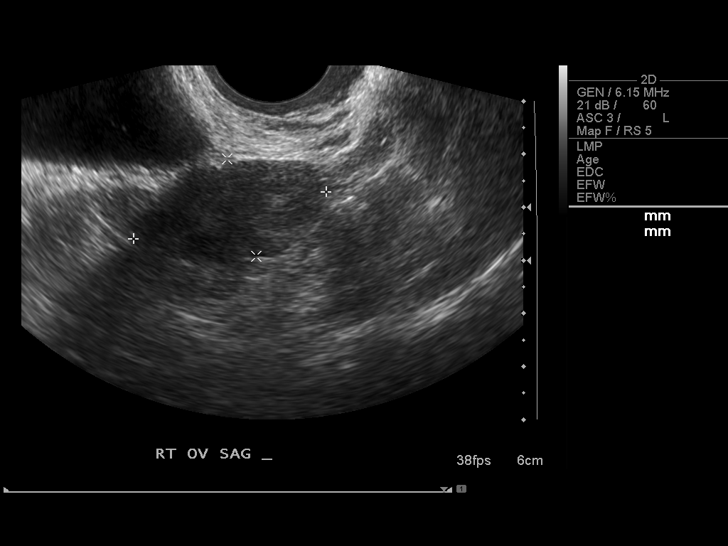
[im 31/39]
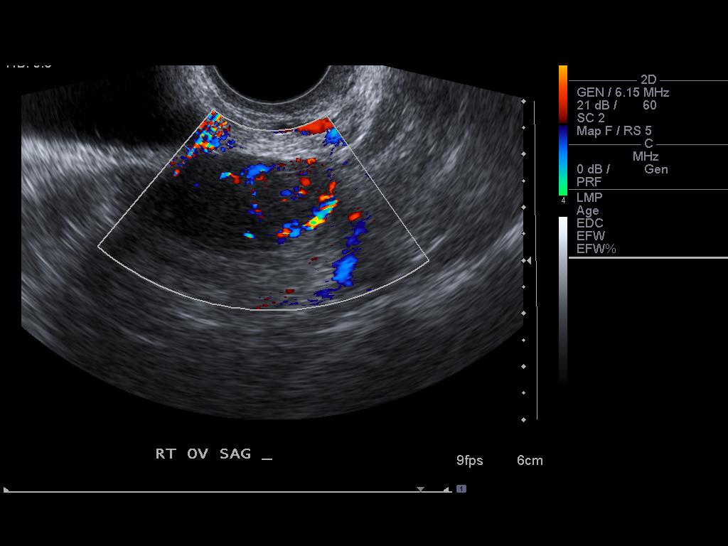
[im 34/39]
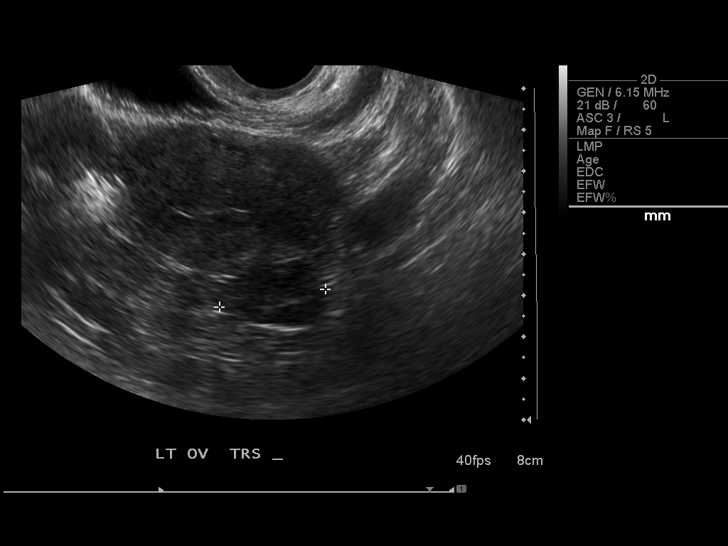
[im 37/39]
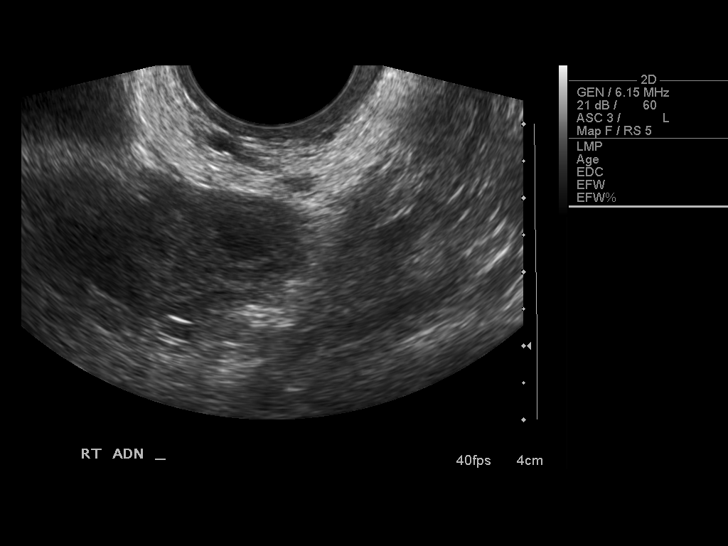

[13 of 28 positions shown; findings below may reference images not displayed]

Intrauterine gestational sac:  No intrauterine pregnancy
demonstrated.
Yolk sac: Not visualized.
Embryo: Not visualized.
Cardiac Activity: Not visualized.

Maternal uterus/adnexae:
The uterus is anteverted.  No myometrial mass lesions demonstrated.
Nabothian cysts in the cervix.  Intrauterine device appears to be
in place within the endometrial cavity.  No endometrial fluid
collections are demonstrated.

The right ovary measures 3.7 x 1.9 x 2.3 cm.  Possible complex
cystic structure demonstrated the right ovary suggesting
hemorrhagic cyst or corpus luteal cyst.  Peripheral flow is
demonstrated.  The left ovary measures 2.9 x 1.3 x 2.6 cm.  No
abnormal adnexal masses are demonstrated.  Small amount of free
fluid in the cul-de-sac.
IMPRESSION: Intrauterine device in place.  No intrauterine pregnancy
demonstrated.  No abnormal adnexal masses.  Findings could
represent failed pregnancy, early intrauterine pregnancy too small
to see, or occult ectopic pregnancy.  Recommend follow-up with
serial quantitative beta HCG levels and / or short-term ultrasound
in 10-14 days as clinically indicated.

## 2014-05-19 ENCOUNTER — Encounter (HOSPITAL_COMMUNITY): Payer: Self-pay | Admitting: *Deleted

## 2014-05-19 ENCOUNTER — Inpatient Hospital Stay (HOSPITAL_COMMUNITY)
Admission: AD | Admit: 2014-05-19 | Discharge: 2014-05-19 | Disposition: A | Discharge status: Home / Self Care | Payer: Medicaid Other | Source: Ambulatory Visit | Attending: Obstetrics and Gynecology | Admitting: Obstetrics and Gynecology

## 2014-05-19 DIAGNOSIS — O99330 Smoking (tobacco) complicating pregnancy, unspecified trimester: Secondary | ICD-10-CM | POA: Insufficient documentation

## 2014-05-19 DIAGNOSIS — K5900 Constipation, unspecified: Secondary | ICD-10-CM | POA: Insufficient documentation

## 2014-05-19 DIAGNOSIS — R110 Nausea: Secondary | ICD-10-CM

## 2014-05-19 DIAGNOSIS — O210 Mild hyperemesis gravidarum: Secondary | ICD-10-CM | POA: Insufficient documentation

## 2014-05-19 DIAGNOSIS — Z3490 Encounter for supervision of normal pregnancy, unspecified, unspecified trimester: Secondary | ICD-10-CM

## 2014-05-19 HISTORY — DX: Other specified health status: Z78.9

## 2014-05-19 LAB — URINE MICROSCOPIC-ADD ON

## 2014-05-19 LAB — URINALYSIS, ROUTINE W REFLEX MICROSCOPIC
BILIRUBIN URINE: NEGATIVE
Glucose, UA: NEGATIVE mg/dL
Ketones, ur: NEGATIVE mg/dL
NITRITE: NEGATIVE
Protein, ur: NEGATIVE mg/dL
SPECIFIC GRAVITY, URINE: 1.02 (ref 1.005–1.030)
UROBILINOGEN UA: 0.2 mg/dL (ref 0.0–1.0)
pH: 6 (ref 5.0–8.0)

## 2014-05-19 LAB — WET PREP, GENITAL
CLUE CELLS WET PREP: NONE SEEN
Trich, Wet Prep: NONE SEEN
Yeast Wet Prep HPF POC: NONE SEEN

## 2014-05-19 LAB — POCT PREGNANCY, URINE: Preg Test, Ur: POSITIVE — AB

## 2014-05-19 MED ORDER — METOCLOPRAMIDE HCL 10 MG PO TABS: 10.0000 mg | ORAL_TABLET | Freq: Four times a day (QID) | ORAL | Status: DC

## 2014-05-19 NOTE — MAU Provider Note (Signed)
History     CSN: [id number]  Arrival date and time: 05/19/14 7829   First Provider Initiated Contact with Patient 05/19/14 1036      Chief Complaint  Patient presents with  . Possible Pregnancy  . Morning Sickness  . Constipation   HPI Comments: Julie Choi 38 y.o. G2P1 presents to MAU with positive pregnancy test from last night, is unsure of LMP and is nauseated and has constipation. She has Colace at home but has not taken it. She plans her care with Dr Ruthann Cancer.  Possible Pregnancy Associated symptoms include nausea.  Constipation Associated symptoms include nausea.      Past Medical History  Diagnosis Date  . Medical history non-contributory     Past Surgical History  Procedure Laterality Date  . Wisdom tooth extraction      History reviewed. No pertinent family history.  History  Substance Use Topics  . Smoking status: Current Every Day Smoker -- 0.50 packs/day for 22 years    Types: Cigarettes  . Smokeless tobacco: Not on file  . Alcohol Use: Yes     Comment: ocassionally    Allergies:  Allergies  Allergen Reactions  . Other Hives    From Emeril's spaghetti sauce  . Tomato Hives  . Vicodin [Hydrocodone-Acetaminophen] Other (See Comments)    Paranoia at high doses    Prescriptions prior to admission  Medication Sig Dispense Refill  . Docusate Calcium (STOOL SOFTENER PO) Take 1 tablet by mouth daily as needed (constipation).      Marland Kitchen ibuprofen (ADVIL,MOTRIN) 200 MG tablet Take 400 mg by mouth every 6 (six) hours as needed for moderate pain.       Marland Kitchen loratadine (CLARITIN) 10 MG tablet Take 10 mg by mouth daily as needed for allergies.      . miconazole (MICOTIN) 100 MG vaginal suppository Place 100 mg vaginally at bedtime as needed (itching).      Marland Kitchen albuterol (PROVENTIL HFA;VENTOLIN HFA) 108 (90 BASE) MCG/ACT inhaler Inhale 2 puffs into the lungs 2 (two) times daily as needed for wheezing or shortness of breath.         Review of Systems   Constitutional: Negative.   HENT: Negative.   Respiratory: Negative.   Cardiovascular: Negative.   Gastrointestinal: Positive for nausea and constipation.  Genitourinary: Negative.   Skin: Negative.   Neurological: Negative.   Psychiatric/Behavioral: Negative.    Physical Exam   Blood pressure 131/62, pulse 102, temperature 98.7 F (37.1 C), resp. rate 18, height 5\' 3"  (1.6 m), weight 112.492 kg (248 lb), last menstrual period 04/18/2014.  Physical Exam  Constitutional: She is oriented to person, place, and time. She appears well-developed and well-nourished. No distress.  HENT:  Head: Normocephalic and atraumatic.  GI: Soft. Bowel sounds are normal. She exhibits no distension. There is no tenderness. There is no rebound and no guarding.  Genitourinary:  Genital:External negative Vaginal:small amount thin white discharge Cervix:small amount blood at os Bimanual:nontender   Musculoskeletal: Normal range of motion.  Neurological: She is alert and oriented to person, place, and time.  Skin: Skin is warm.  Psychiatric: She has a normal mood and affect. Her behavior is normal. Thought content normal.   Results for orders placed during the hospital encounter of 05/19/14 (from the past 24 hour(s))  URINALYSIS, ROUTINE W REFLEX MICROSCOPIC     Status: Abnormal   Collection Time    05/19/14  9:30 AM      Result Value Ref Range   Color,  Urine YELLOW  YELLOW   APPearance CLEAR  CLEAR   Specific Gravity, Urine 1.020  1.005 - 1.030   pH 6.0  5.0 - 8.0   Glucose, UA NEGATIVE  NEGATIVE mg/dL   Hgb urine dipstick TRACE (*) NEGATIVE   Bilirubin Urine NEGATIVE  NEGATIVE   Ketones, ur NEGATIVE  NEGATIVE mg/dL   Protein, ur NEGATIVE  NEGATIVE mg/dL   Urobilinogen, UA 0.2  0.0 - 1.0 mg/dL   Nitrite NEGATIVE  NEGATIVE   Leukocytes, UA TRACE (*) NEGATIVE  URINE MICROSCOPIC-ADD ON     Status: Abnormal   Collection Time    05/19/14  9:30 AM      Result Value Ref Range   Squamous  Epithelial / LPF MANY (*) RARE   WBC, UA 0-2  <3 WBC/hpf   RBC / HPF 0-2  <3 RBC/hpf   Bacteria, UA RARE  RARE   Urine-Other MUCOUS PRESENT    POCT PREGNANCY, URINE     Status: Abnormal   Collection Time    05/19/14  9:42 AM      Result Value Ref Range   Preg Test, Ur POSITIVE (*) NEGATIVE  WET PREP, GENITAL     Status: Abnormal   Collection Time    05/19/14 10:40 AM      Result Value Ref Range   Yeast Wet Prep HPF POC NONE SEEN  NONE SEEN   Trich, Wet Prep NONE SEEN  NONE SEEN   Clue Cells Wet Prep HPF POC NONE SEEN  NONE SEEN   WBC, Wet Prep HPF POC MODERATE (*) NONE SEEN     MAU Course  Procedures  MDM Wet prep, GC, Chlamydia, urine culture  Assessment and Plan   A: Nausea in early Pregnancy  P: Reglan 10mg  po q8 hours prn Establish care with Dr Ruthann Cancer Take Colace as needed Drink plenty fluids  Connye Burkitt Noella Kipnis 05/19/2014, 10:45 AM

## 2014-05-19 NOTE — MAU Note (Signed)
Pt presents to MAU with complaints of nausea, constipation and states she had a +HPT last night.

## 2014-05-19 NOTE — Discharge Instructions (Signed)
Morning Sickness °Morning sickness is when you feel sick to your stomach (nauseous) during pregnancy. This nauseous feeling may or may not come with vomiting. It often occurs in the morning but can be a problem any time of day. Morning sickness is most common during the first trimester, but it may continue throughout pregnancy. While morning sickness is unpleasant, it is usually harmless unless you develop severe and continual vomiting (hyperemesis gravidarum). This condition requires more intense treatment.  °CAUSES  °The cause of morning sickness is not completely known but seems to be related to normal hormonal changes that occur in pregnancy. °RISK FACTORS °You are at greater risk if you: °· Experienced nausea or vomiting before your pregnancy. °· Had morning sickness during a previous pregnancy. °· Are pregnant with more than one baby, such as twins. °TREATMENT  °Do not use any medicines (prescription, over-the-counter, or herbal) for morning sickness without first talking to your health care provider. Your health care provider may prescribe or recommend: °· Vitamin B6 supplements. °· Anti-nausea medicines. °· The herbal medicine ginger. °HOME CARE INSTRUCTIONS  °· Only take over-the-counter or prescription medicines as directed by your health care provider. °· Taking multivitamins before getting pregnant can prevent or decrease the severity of morning sickness in most women.   °· Eat a piece of dry toast or unsalted crackers before getting out of bed in the morning.   °· Eat five or six small meals a day.   °· Eat dry and bland foods (rice, baked potato ). Foods high in carbohydrates are often helpful.  °· Do not drink liquids with your meals. Drink liquids between meals.   °· Avoid greasy, fatty, and spicy foods.   °· Get someone to cook for you if the smell of any food causes nausea and vomiting.   °· If you feel nauseous after taking prenatal vitamins, take the vitamins at night or with a snack.  °· Snack  on protein foods (nuts, yogurt, cheese) between meals if you are hungry.   °· Eat unsweetened gelatins for desserts.   °· Wearing an acupressure wristband (worn for sea sickness) may be helpful.   °· Acupuncture may be helpful.   °· Do not smoke.   °· Get a humidifier to keep the air in your house free of odors.   °· Get plenty of fresh air. °SEEK MEDICAL CARE IF:  °· Your home remedies are not working, and you need medicine. °· You feel dizzy or lightheaded. °· You are losing weight. °SEEK IMMEDIATE MEDICAL CARE IF:  °· You have persistent and uncontrolled nausea and vomiting. °· You pass out (faint). °Document Released: 01/27/2007 Document Revised: 08/08/2013 Document Reviewed: 05/23/2013 °[employer]® Patient Information ©2014 [employer]. ° °

## 2014-05-19 NOTE — MAU Provider Note (Signed)
Attestation of Attending Supervision of Advanced Practitioner: Evaluation and management procedures were performed by the PA/NP/CNM/OB Fellow under my supervision/collaboration. Chart reviewed and agree with management and plan.  Mallory Shirk V 05/19/2014 1:48 PM

## 2014-05-19 NOTE — MAU Note (Signed)
Pt. Here due to nausea and constipation that began a few weeks ago. Pt. Also states she had a positive HPT last night.

## 2014-05-20 ENCOUNTER — Encounter (HOSPITAL_COMMUNITY): Payer: Self-pay | Admitting: *Deleted

## 2014-05-20 LAB — GC/CHLAMYDIA PROBE AMP
CT Probe RNA: NEGATIVE
GC Probe RNA: NEGATIVE

## 2014-05-20 LAB — URINE CULTURE
COLONY COUNT: NO GROWTH
Culture: NO GROWTH

## 2014-06-03 ENCOUNTER — Other Ambulatory Visit (HOSPITAL_COMMUNITY): Payer: Self-pay | Admitting: Obstetrics

## 2014-06-03 DIAGNOSIS — O26849 Uterine size-date discrepancy, unspecified trimester: Secondary | ICD-10-CM

## 2014-06-04 ENCOUNTER — Other Ambulatory Visit (HOSPITAL_COMMUNITY): Payer: Self-pay | Admitting: Obstetrics

## 2014-06-04 ENCOUNTER — Ambulatory Visit (HOSPITAL_COMMUNITY)
Admission: RE | Admit: 2014-06-04 | Discharge: 2014-06-04 | Disposition: A | Discharge status: Home / Self Care | Payer: Medicaid Other | Source: Ambulatory Visit | Attending: Obstetrics | Admitting: Obstetrics

## 2014-06-04 DIAGNOSIS — O26849 Uterine size-date discrepancy, unspecified trimester: Secondary | ICD-10-CM | POA: Insufficient documentation

## 2014-06-04 DIAGNOSIS — Z3689 Encounter for other specified antenatal screening: Secondary | ICD-10-CM | POA: Insufficient documentation

## 2014-09-13 ENCOUNTER — Emergency Department (HOSPITAL_COMMUNITY): Payer: Medicaid Other

## 2014-09-13 ENCOUNTER — Emergency Department (HOSPITAL_COMMUNITY)
Admission: EM | Admit: 2014-09-13 | Discharge: 2014-09-14 | Disposition: A | Discharge status: Home / Self Care | Payer: Medicaid Other | Attending: Emergency Medicine | Admitting: Emergency Medicine

## 2014-09-13 ENCOUNTER — Encounter (HOSPITAL_COMMUNITY): Payer: Self-pay | Admitting: Emergency Medicine

## 2014-09-13 DIAGNOSIS — K219 Gastro-esophageal reflux disease without esophagitis: Secondary | ICD-10-CM | POA: Insufficient documentation

## 2014-09-13 DIAGNOSIS — Z8659 Personal history of other mental and behavioral disorders: Secondary | ICD-10-CM | POA: Diagnosis not present

## 2014-09-13 DIAGNOSIS — R51 Headache: Secondary | ICD-10-CM | POA: Diagnosis present

## 2014-09-13 DIAGNOSIS — Z79899 Other long term (current) drug therapy: Secondary | ICD-10-CM | POA: Diagnosis not present

## 2014-09-13 DIAGNOSIS — Z3202 Encounter for pregnancy test, result negative: Secondary | ICD-10-CM | POA: Insufficient documentation

## 2014-09-13 DIAGNOSIS — F17200 Nicotine dependence, unspecified, uncomplicated: Secondary | ICD-10-CM | POA: Insufficient documentation

## 2014-09-13 DIAGNOSIS — R519 Headache, unspecified: Secondary | ICD-10-CM

## 2014-09-13 HISTORY — DX: Anxiety disorder, unspecified: F41.9

## 2014-09-13 LAB — CBC WITH DIFFERENTIAL/PLATELET
BASOS ABS: 0 10*3/uL (ref 0.0–0.1)
BASOS PCT: 0 % (ref 0–1)
EOS PCT: 3 % (ref 0–5)
Eosinophils Absolute: 0.3 10*3/uL (ref 0.0–0.7)
HEMATOCRIT: 40.7 % (ref 36.0–46.0)
Hemoglobin: 13.5 g/dL (ref 12.0–15.0)
Lymphocytes Relative: 25 % (ref 12–46)
Lymphs Abs: 2.5 10*3/uL (ref 0.7–4.0)
MCH: 28.8 pg (ref 26.0–34.0)
MCHC: 33.2 g/dL (ref 30.0–36.0)
MCV: 86.8 fL (ref 78.0–100.0)
MONO ABS: 0.5 10*3/uL (ref 0.1–1.0)
Monocytes Relative: 5 % (ref 3–12)
NEUTROS ABS: 6.7 10*3/uL (ref 1.7–7.7)
Neutrophils Relative %: 67 % (ref 43–77)
PLATELETS: 318 10*3/uL (ref 150–400)
RBC: 4.69 MIL/uL (ref 3.87–5.11)
RDW: 13.1 % (ref 11.5–15.5)
WBC: 10 10*3/uL (ref 4.0–10.5)

## 2014-09-13 LAB — COMPREHENSIVE METABOLIC PANEL
ALBUMIN: 3.7 g/dL (ref 3.5–5.2)
ALT: 13 U/L (ref 0–35)
AST: 17 U/L (ref 0–37)
Alkaline Phosphatase: 72 U/L (ref 39–117)
Anion gap: 10 (ref 5–15)
BUN: 7 mg/dL (ref 6–23)
CALCIUM: 9 mg/dL (ref 8.4–10.5)
CHLORIDE: 101 meq/L (ref 96–112)
CO2: 29 mEq/L (ref 19–32)
Creatinine, Ser: 0.71 mg/dL (ref 0.50–1.10)
GFR calc Af Amer: >90 mL/min (ref >90)
GFR calc non Af Amer: >90 mL/min (ref >90)
Glucose, Bld: 101 mg/dL — ABNORMAL HIGH (ref 70–99)
Potassium: 3.5 mEq/L — ABNORMAL LOW (ref 3.7–5.3)
SODIUM: 140 meq/L (ref 137–147)
Total Bilirubin: 0.3 mg/dL (ref 0.3–1.2)
Total Protein: 7.2 g/dL (ref 6.0–8.3)

## 2014-09-13 NOTE — ED Notes (Addendum)
Pt c/o HA intermittent x 7 days, dizziness for same. Pt c/o upper chest "burning" x 1 week, cough x 1 week, worse at night with night sweats.  Pt states symptoms have gotten worse since having miscarriage in July, pt states she is concerned she lost too much blood

## 2014-09-14 LAB — POC URINE PREG, ED: PREG TEST UR: NEGATIVE

## 2014-09-14 LAB — URINALYSIS, ROUTINE W REFLEX MICROSCOPIC
Bilirubin Urine: NEGATIVE
GLUCOSE, UA: NEGATIVE mg/dL
Ketones, ur: NEGATIVE mg/dL
Nitrite: NEGATIVE
PH: 6 (ref 5.0–8.0)
Protein, ur: NEGATIVE mg/dL
Specific Gravity, Urine: 1.017 (ref 1.005–1.030)
Urobilinogen, UA: 0.2 mg/dL (ref 0.0–1.0)

## 2014-09-14 LAB — URINE MICROSCOPIC-ADD ON

## 2014-09-14 MED ORDER — FLUTICASONE PROPIONATE 50 MCG/ACT NA SUSP: 2.0000 | Freq: Every day | NASAL | Status: AC

## 2014-09-14 MED ORDER — FLUTICASONE PROPIONATE 50 MCG/ACT NA SUSP
2.0000 | Freq: Every day | NASAL | Status: DC
  Administered 2014-09-14: 2 via NASAL
  Filled 2014-09-14: qty 16

## 2014-09-14 MED ORDER — FAMOTIDINE 20 MG PO TABS
20.0000 mg | ORAL_TABLET | Freq: Once | ORAL | Status: AC
  Administered 2014-09-14: 20 mg via ORAL
  Filled 2014-09-14: qty 1

## 2014-09-14 MED ORDER — FAMOTIDINE 20 MG PO TABS: 20.0000 mg | ORAL_TABLET | Freq: Two times a day (BID) | ORAL | Status: DC

## 2014-09-14 MED ORDER — ACETAMINOPHEN 325 MG PO TABS: 650.0000 mg | ORAL_TABLET | Freq: Once | ORAL | Status: DC

## 2014-09-14 MED ORDER — GI COCKTAIL ~~LOC~~
30.0000 mL | Freq: Once | ORAL | Status: AC
  Administered 2014-09-14: 30 mL via ORAL
  Filled 2014-09-14: qty 30

## 2014-09-14 NOTE — ED Provider Notes (Signed)
CSN: [id number]     Arrival date & time 09/13/14  2146 History   First MD Initiated Contact with Patient 09/14/14 0049     Chief Complaint  Patient presents with  . Headache     (Consider location/radiation/quality/duration/timing/severity/associated sxs/prior Treatment) HPI 38 yo female presents to the ER from home with complaint of 1 week of intermittent frontal headache, 3 episodes of coughing, dizziness when standing, and burning of upper abdomen and chest.  Pt was also exposed to several people who have pneumonia and is concerned that she may be coming down with it.  No fever, chills.  Cough was 3 episodes, not currently coughing, and was nonproductive.  Headache has been intermittent, lasting anywhere from a few minutes to hours.  She took claritin for 3 days as she has had allergies in the past and had no headaches while taking claritin.  She stopped taking claritin and headaches returned.  Pt reports she had miscarriage in June and is concerned about anemia. Past Medical History  Diagnosis Date  . Medical history non-contributory   . Anxiety    Past Surgical History  Procedure Laterality Date  . Wisdom tooth extraction     No family history on file. History  Substance Use Topics  . Smoking status: Current Every Day Smoker -- 0.50 packs/day for 22 years    Types: Cigarettes  . Smokeless tobacco: Not on file  . Alcohol Use: Yes     Comment: ocassionally   OB History   Grav Para Term Preterm Abortions TAB SAB Ect Mult Living   3 1        1      Review of Systems  All other systems reviewed and are negative.     Allergies  Other; Tomato; and Vicodin  Home Medications   Prior to Admission medications   Medication Sig Start Date End Date Taking? Authorizing Provider  ibuprofen (ADVIL,MOTRIN) 200 MG tablet Take 400 mg by mouth every 6 (six) hours as needed for moderate pain.    Yes Historical Provider, MD  loratadine (CLARITIN) 10 MG tablet Take 10 mg by mouth daily  as needed for allergies.   Yes Historical Provider, MD  albuterol (PROVENTIL HFA;VENTOLIN HFA) 108 (90 BASE) MCG/ACT inhaler Inhale 2 puffs into the lungs 2 (two) times daily as needed for wheezing or shortness of breath.     Historical Provider, MD  famotidine (PEPCID) 20 MG tablet Take 1 tablet (20 mg total) by mouth 2 (two) times daily. 09/14/14   Kalman Drape, MD  fluticasone (FLONASE) 50 MCG/ACT nasal spray Place 2 sprays into both nostrils daily. 09/14/14   Kalman Drape, MD   BP 158/95  Pulse 61  Temp(Src) 98.6 F (37 C) (Oral)  Resp 15  Ht 5\' 2"  (1.575 m)  Wt 240 lb (108.863 kg)  BMI 43.89 kg/m2  SpO2 98%  LMP 09/11/2014  Breastfeeding? Unknown Physical Exam  Nursing note and vitals reviewed. Constitutional: She is oriented to person, place, and time. She appears well-developed and well-nourished.  HENT:  Head: Normocephalic and atraumatic.  Nose: Nose normal.  Mouth/Throat: Oropharynx is clear and moist.  Eyes: Conjunctivae and EOM are normal. Pupils are equal, round, and reactive to light.  Neck: Normal range of motion. Neck supple. No JVD present. No tracheal deviation present. No thyromegaly present.  Cardiovascular: Normal rate, regular rhythm, normal heart sounds and intact distal pulses.  Exam reveals no gallop and no friction rub.   No murmur heard. Pulmonary/Chest: Effort  normal and breath sounds normal. No stridor. No respiratory distress. She has no wheezes. She has no rales. She exhibits no tenderness.  Abdominal: Soft. Bowel sounds are normal. She exhibits no distension and no mass. There is no tenderness. There is no rebound and no guarding.  Musculoskeletal: Normal range of motion. She exhibits no edema and no tenderness.  Lymphadenopathy:    She has no cervical adenopathy.  Neurological: She is alert and oriented to person, place, and time. She displays normal reflexes. No cranial nerve deficit. She exhibits normal muscle tone. Coordination normal.  Skin: Skin is  warm and dry. No rash noted. No erythema. No pallor.  Psychiatric: She has a normal mood and affect. Her behavior is normal. Judgment and thought content normal.    ED Course  Procedures (including critical care time) Labs Review Labs Reviewed  COMPREHENSIVE METABOLIC PANEL - Abnormal; Notable for the following:    Potassium 3.5 (*)    Glucose, Bld 101 (*)    All other components within normal limits  URINALYSIS, ROUTINE W REFLEX MICROSCOPIC - Abnormal; Notable for the following:    APPearance CLOUDY (*)    Hgb urine dipstick SMALL (*)    Leukocytes, UA SMALL (*)    All other components within normal limits  URINE MICROSCOPIC-ADD ON - Abnormal; Notable for the following:    Squamous Epithelial / LPF FEW (*)    All other components within normal limits  CBC WITH DIFFERENTIAL  POC URINE PREG, ED    Imaging Review Dg Chest 2 View  09/13/2014   CLINICAL DATA:  Chest pain.  Cough.  Smoker.  EXAM: CHEST  2 VIEW  COMPARISON:  06/20/2012  FINDINGS: The heart size and mediastinal contours are within normal limits. Both lungs are clear. The visualized skeletal structures are unremarkable.  IMPRESSION: No active cardiopulmonary disease.   Electronically Signed   By: Lucienne Capers M.D.   On: 09/13/2014 23:23     EKG Interpretation   Date/Time:  Friday September 13 2014 22:42:36 EDT Ventricular Rate:  71 PR Interval:  149 QRS Duration: 90 QT Interval:  392 QTC Calculation: 426 R Axis:   49 Text Interpretation:  Sinus rhythm with premature supraventricular  complexes No significant change since last tracing Confirmed by Stephaney Steven  MD,  Jennae Hakeem ([id number]) on 09/14/2014 12:56:05 AM      MDM   Final diagnoses:  Nonintractable episodic headache, unspecified headache type  Gastroesophageal reflux disease, esophagitis presence not specified   38 yo female with headache, possible seasonal allergies/sinusitis.  Chest burning sxs seem to be GERD.  Will treat symptoms and have her f/u with  pcm.  Kalman Drape, MD 09/14/14 [phone]

## 2014-09-14 NOTE — Discharge Instructions (Signed)
Continue taking Claritin when having allergy symptoms: runny nose, congestion, sneezing, headaches.  Start using Flonase.  You were given a prescription for Pepcid to help with heartburn symptoms.   Food Choices for Gastroesophageal Reflux Disease When you have gastroesophageal reflux disease (GERD), the foods you eat and your eating habits are very important. Choosing the right foods can help ease your discomfort.  WHAT GUIDELINES DO I NEED TO FOLLOW?   Choose fruits, vegetables, whole grains, and low-fat dairy products.   Choose low-fat meat, fish, and poultry.  Limit fats such as oils, salad dressings, butter, nuts, and avocado.   Keep a food diary. This helps you identify foods that cause symptoms.   Avoid foods that cause symptoms. These may be different for everyone.   Eat small meals often instead of 3 large meals a day.   Eat your meals slowly, in a place where you are relaxed.   Limit fried foods.   Cook foods using methods other than frying.   Avoid drinking alcohol.   Avoid drinking large amounts of liquids with your meals.   Avoid bending over or lying down until 2-3 hours after eating.  WHAT FOODS ARE NOT RECOMMENDED?  These are some foods and drinks that may make your symptoms worse: Vegetables Tomatoes. Tomato juice. Tomato and spaghetti sauce. Chili peppers. Onion and garlic. Horseradish. Fruits Oranges, grapefruit, and lemon (fruit and juice). Meats High-fat meats, fish, and poultry. This includes hot dogs, ribs, ham, sausage, salami, and bacon. Dairy Whole milk and chocolate milk. Sour cream. Cream. Butter. Ice cream. Cream cheese.  Drinks Coffee and tea. Bubbly (carbonated) drinks or energy drinks. Condiments Hot sauce. Barbecue sauce.  Sweets/Desserts Chocolate and cocoa. Donuts. Peppermint and spearmint. Fats and Oils High-fat foods. This includes [place] fries and potato chips. Other Vinegar. Strong spices. This includes black pepper,  white pepper, red pepper, cayenne, curry powder, cloves, ginger, and chili powder. The items listed above may not be a complete list of foods and drinks to avoid. Contact your dietitian for more information. Document Released: 06/06/2012 Document Revised: 12/11/2013 Document Reviewed: 10/10/2013 [facility] Patient Information 2015 [place], [place]. This information is not intended to replace advice given to you by your health care provider. Make sure you discuss any questions you have with your health care provider.  Gastroesophageal Reflux Disease, Adult Gastroesophageal reflux disease (GERD) happens when acid from your stomach goes into your food pipe (esophagus). The acid can cause a burning feeling in your chest. Over time, the acid can make small holes (ulcers) in your food pipe.  HOME CARE  Ask your doctor for advice about:  Losing weight.  Quitting smoking.  Alcohol use.  Avoid foods and drinks that make your problems worse. You may want to avoid:  Caffeine and alcohol.  Chocolate.  Mints.  Garlic and onions.  Spicy foods.  Citrus fruits, such as oranges, lemons, or limes.  Foods that contain tomato, such as sauce, chili, salsa, and pizza.  Fried and fatty foods.  Avoid lying down for 3 hours before you go to bed or before you take a nap.  Eat small meals often, instead of large meals.  Wear loose-fitting clothing. Do not wear anything tight around your waist.  Raise (elevate) the head of your bed 6 to 8 inches with wood blocks. Using extra pillows does not help.  Only take medicines as told by your doctor.  Do not take aspirin or ibuprofen. GET HELP RIGHT AWAY IF:   You have pain in  your arms, neck, jaw, teeth, or back.  Your pain gets worse or changes.  You feel sick to your stomach (nauseous), throw up (vomit), or sweat (diaphoresis).  You feel short of breath, or you pass out (faint).  Your throw up is green, yellow, black, or looks like coffee grounds  or blood.  Your poop (stool) is red, bloody, or black. MAKE SURE YOU:   Understand these instructions.  Will watch your condition.  Will get help right away if you are not doing well or get worse. Document Released: 05/24/2008 Document Revised: 02/28/2012 Document Reviewed: 06/25/2011 [facility] Patient Information 2015 [place], [place]. This information is not intended to replace advice given to you by your health care provider. Make sure you discuss any questions you have with your health care provider.  General Headache Without Cause A headache is pain or discomfort felt around the head or neck area. The specific cause of a headache may not be found. There are many causes and types of headaches. A few common ones are:  Tension headaches.  Migraine headaches.  Cluster headaches.  Chronic daily headaches. HOME CARE INSTRUCTIONS   Keep all follow-up appointments with your caregiver or any specialist referral.  Only take over-the-counter or prescription medicines for pain or discomfort as directed by your caregiver.  Lie down in a dark, quiet room when you have a headache.  Keep a headache journal to find out what may trigger your migraine headaches. For example, write down:  What you eat and drink.  How much sleep you get.  Any change to your diet or medicines.  Try massage or other relaxation techniques.  Put ice packs or heat on the head and neck. Use these 3 to 4 times per day for 15 to 20 minutes each time, or as needed.  Limit stress.  Sit up straight, and do not tense your muscles.  Quit smoking if you smoke.  Limit alcohol use.  Decrease the amount of caffeine you drink, or stop drinking caffeine.  Eat and sleep on a regular schedule.  Get 7 to 9 hours of sleep, or as recommended by your caregiver.  Keep lights dim if bright lights bother you and make your headaches worse. SEEK MEDICAL CARE IF:   You have problems with the medicines you were  prescribed.  Your medicines are not working.  You have a change from the usual headache.  You have nausea or vomiting. SEEK IMMEDIATE MEDICAL CARE IF:   Your headache becomes severe.  You have a fever.  You have a stiff neck.  You have loss of vision.  You have muscular weakness or loss of muscle control.  You start losing your balance or have trouble walking.  You feel faint or pass out.  You have severe symptoms that are different from your first symptoms. MAKE SURE YOU:   Understand these instructions.  Will watch your condition.  Will get help right away if you are not doing well or get worse. Document Released: 12/06/2005 Document Revised: 02/28/2012 Document Reviewed: 12/22/2011 [facility] Patient Information 2015 [place], [place]. This information is not intended to replace advice given to you by your health care provider. Make sure you discuss any questions you have with your health care provider.  Sinus Headache A sinus headache happens when your sinuses become clogged or puffy (swollen). Sinus headaches can be mild or severe. HOME CARE  Take your medicines (antibiotics) as told. Finish them even if you start to feel better.  Only take medicine  as told by your doctor.  Use a nose spray if you feel stuffed up (congested). GET HELP RIGHT AWAY IF:  You have a fever.  You have trouble seeing.  You suddenly have pain in your face or head.  You start to twitch or shake (seizure).  You are confused.  You get headaches more than once a week.  Light or sound bothers you.  You feel sick to your stomach (nauseous) or throw up (vomit).  Your headaches do not get better with treatment. MAKE SURE YOU:  Understand these instructions.  Will watch your condition.  Will get help right away if you are not doing well or get worse. Document Released: 04/07/2011 Document Revised: 02/28/2012 Document Reviewed: 04/07/2011 [facility] Patient Information 2015  [place], [place]. This information is not intended to replace advice given to you by your health care provider. Make sure you discuss any questions you have with your health care provider.

## 2014-10-21 ENCOUNTER — Encounter (HOSPITAL_COMMUNITY): Payer: Self-pay | Admitting: Emergency Medicine

## 2014-11-18 DIAGNOSIS — Y9389 Activity, other specified: Secondary | ICD-10-CM | POA: Insufficient documentation

## 2014-11-18 DIAGNOSIS — Y998 Other external cause status: Secondary | ICD-10-CM | POA: Diagnosis not present

## 2014-11-18 DIAGNOSIS — Z7951 Long term (current) use of inhaled steroids: Secondary | ICD-10-CM | POA: Diagnosis not present

## 2014-11-18 DIAGNOSIS — W228XXA Striking against or struck by other objects, initial encounter: Secondary | ICD-10-CM | POA: Insufficient documentation

## 2014-11-18 DIAGNOSIS — S93501A Unspecified sprain of right great toe, initial encounter: Secondary | ICD-10-CM | POA: Diagnosis not present

## 2014-11-18 DIAGNOSIS — Z720 Tobacco use: Secondary | ICD-10-CM | POA: Insufficient documentation

## 2014-11-18 DIAGNOSIS — Y9289 Other specified places as the place of occurrence of the external cause: Secondary | ICD-10-CM | POA: Diagnosis not present

## 2014-11-18 DIAGNOSIS — S99921A Unspecified injury of right foot, initial encounter: Secondary | ICD-10-CM | POA: Diagnosis present

## 2014-11-18 DIAGNOSIS — Z79899 Other long term (current) drug therapy: Secondary | ICD-10-CM | POA: Diagnosis not present

## 2014-11-18 DIAGNOSIS — Z8659 Personal history of other mental and behavioral disorders: Secondary | ICD-10-CM | POA: Insufficient documentation

## 2014-11-18 IMAGING — US US MFM OB TRANSVAGINAL
1 series · 14 of 25 positions shown · non-contrast
Comparison: none

[Series 1: us mfm ob transvaginal · 0.16mm/px · 14 of 25 slices shown]
[im 1/25]
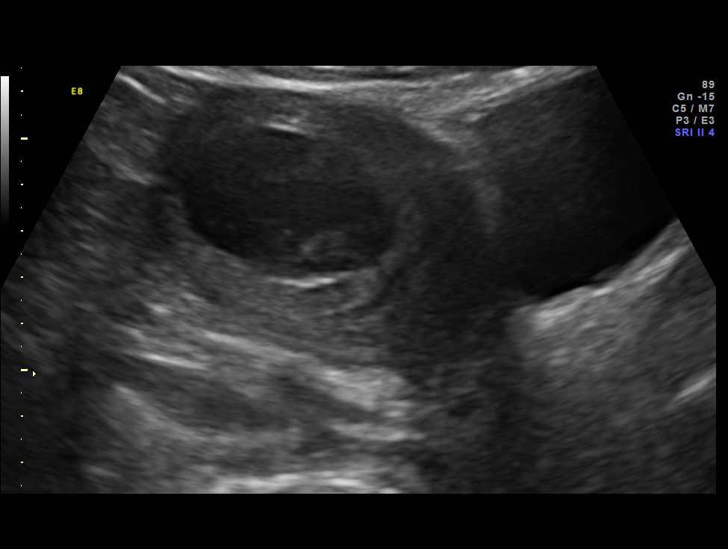
[im 3/25]
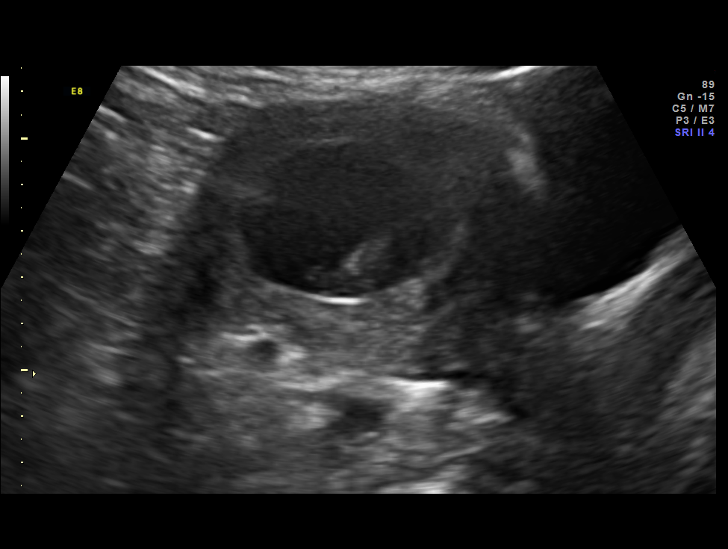
[im 5/25]
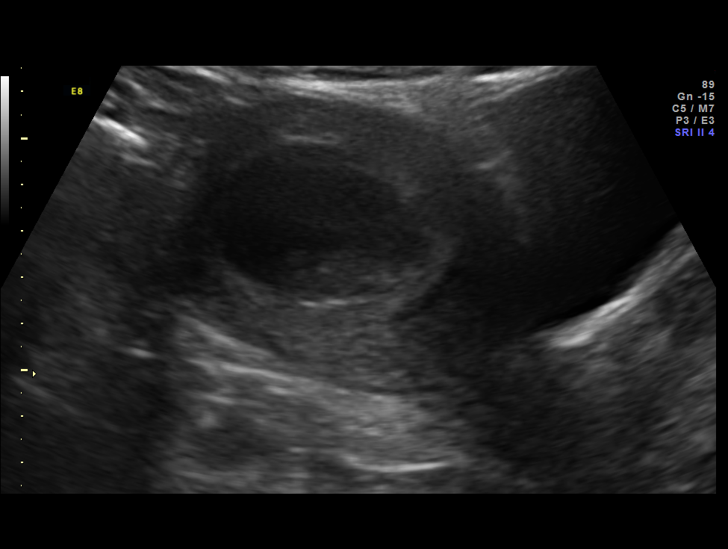
[im 7/25]
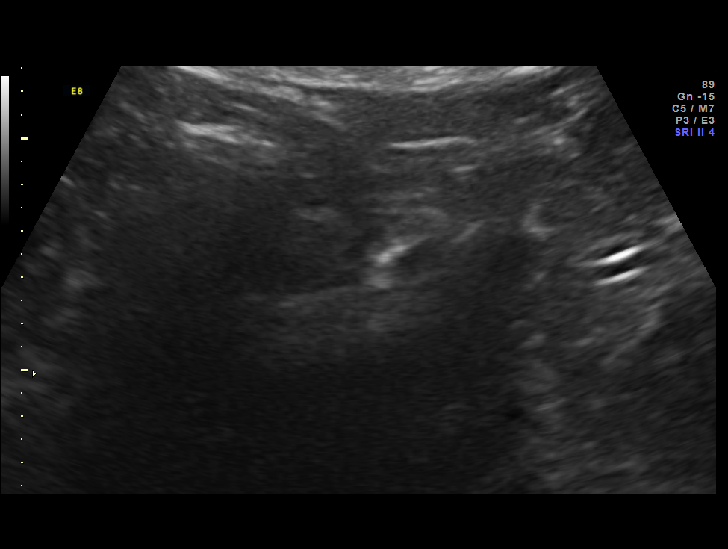
[im 9/25]
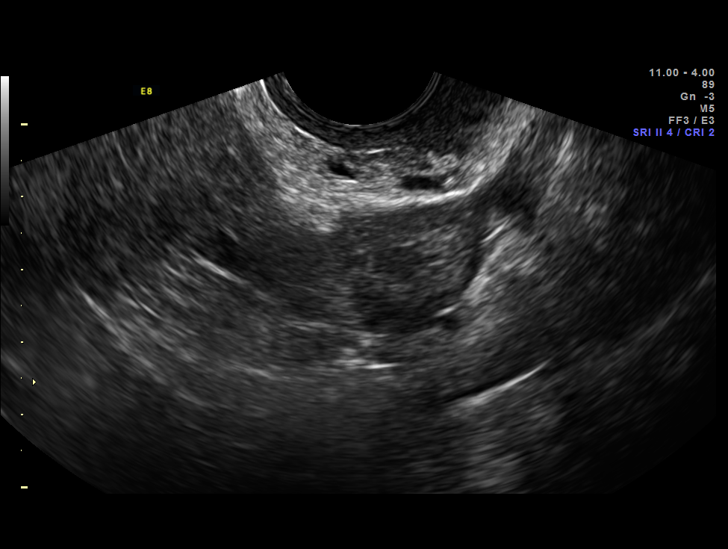
[im 10/25]
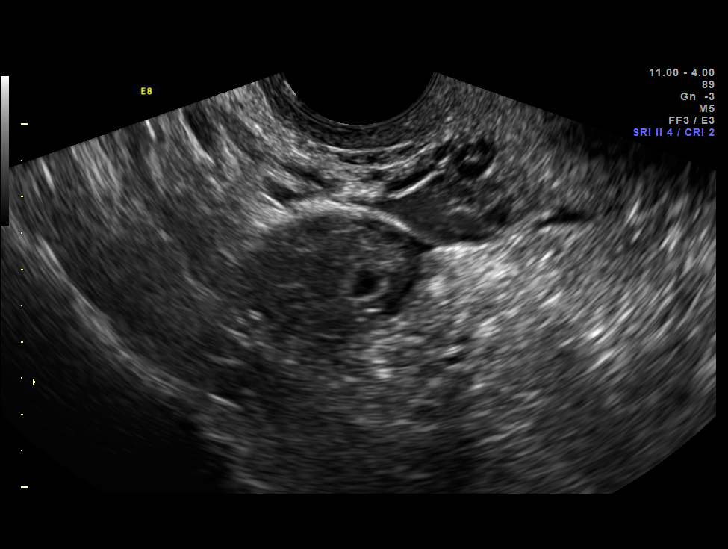
[im 12/25]
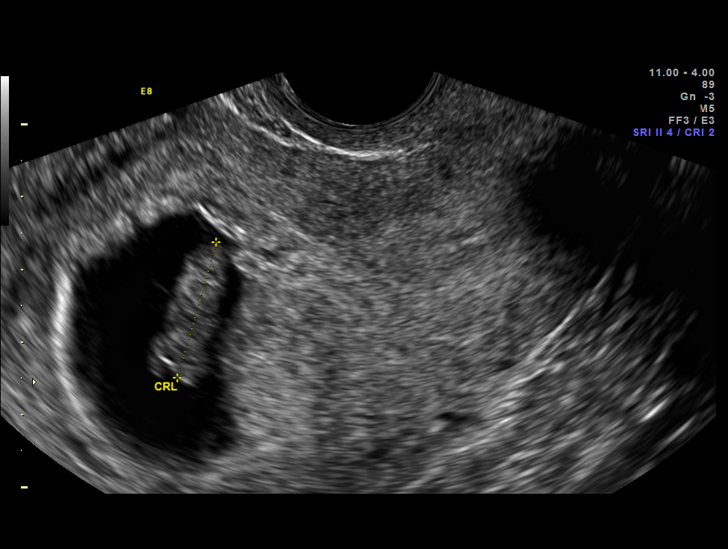
[im 14/25]
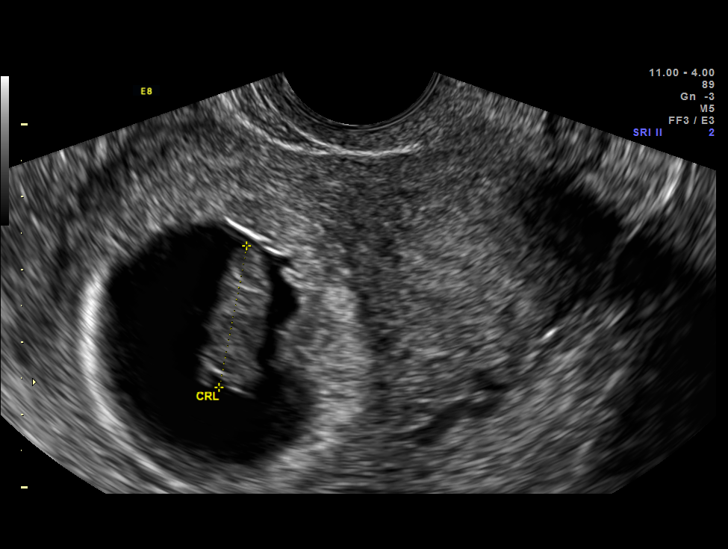
[im 16/25]
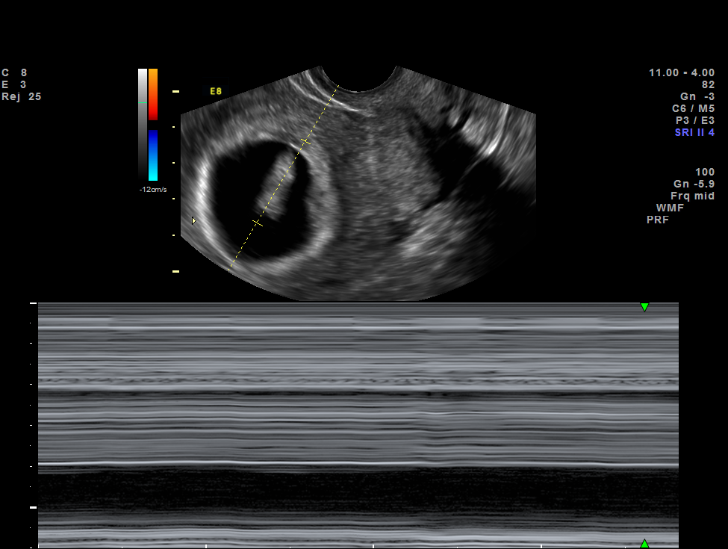
[im 17/25]
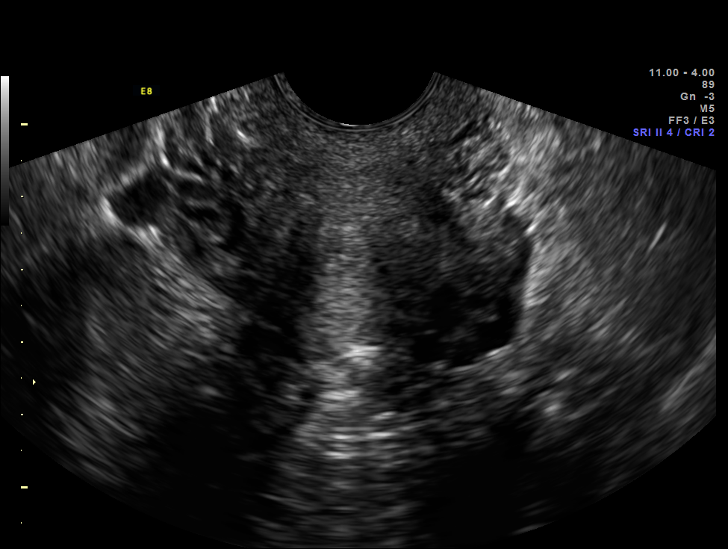
[im 19/25]
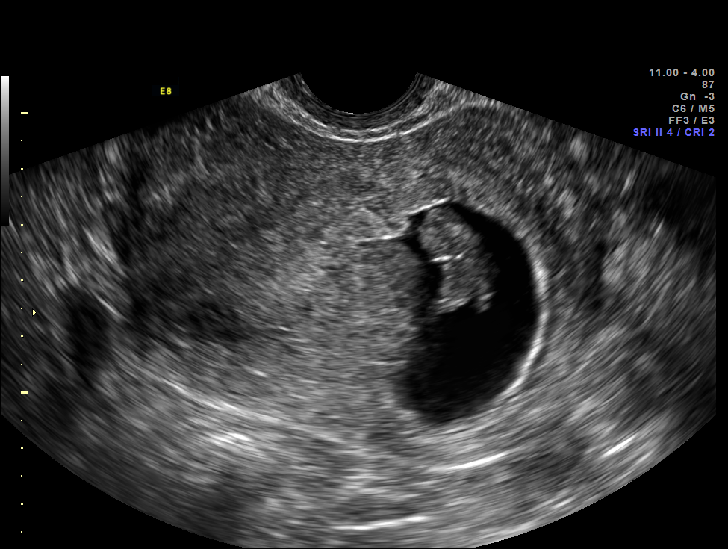
[im 21/25]
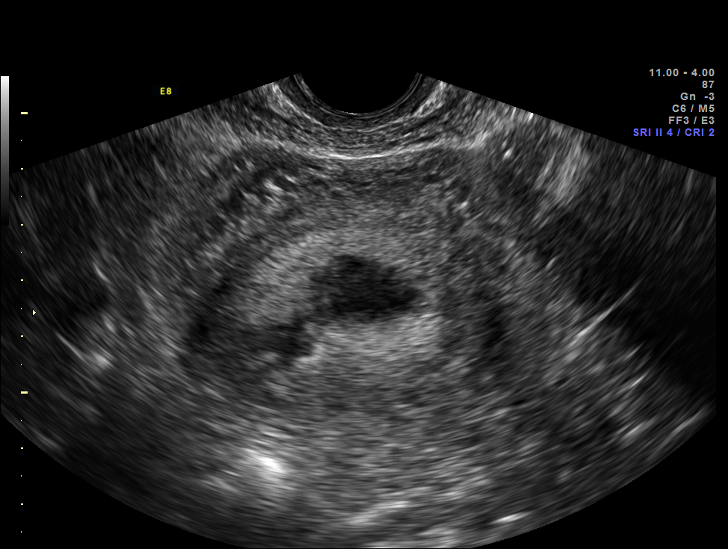
[im 23/25]
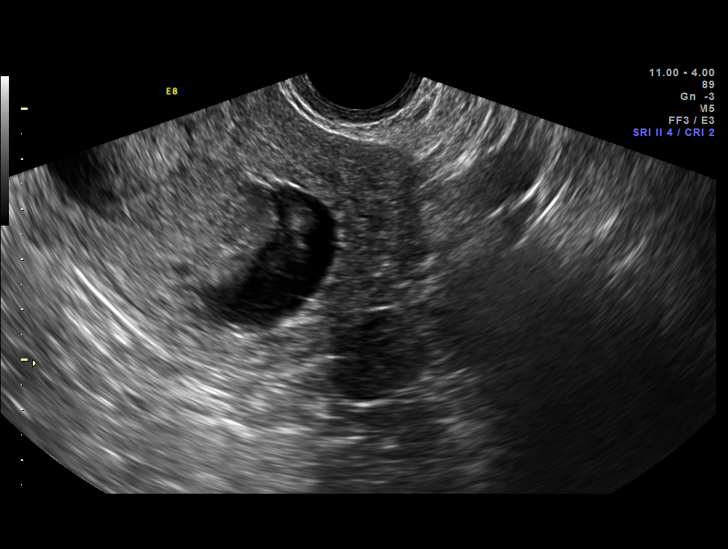
[im 25/25]
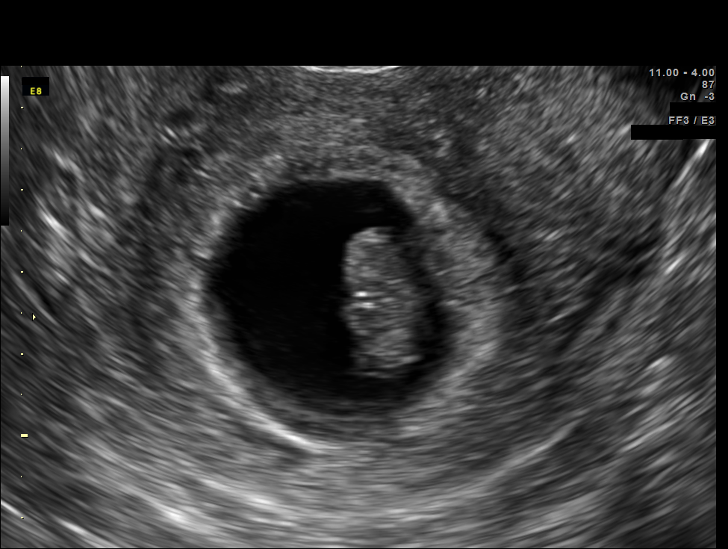

[14 of 25 positions shown; findings below may reference images not displayed]

OBSTETRICS REPORT
                      (Signed Final 06/04/2014 [DATE])

Service(s) Provided

 US MFM OB COMP LESS THAN 14 WEEKS                     76801.4
 US MFM OB TRANSVAGINAL                                76817.2
Indications

 Advanced maternal age (AMA), Multigravida (38)
 Size-Date Discrepancy
 Absent fetal heart tones
 Obesity complicating pregnancy                        649.13,
Fetal Evaluation

 Num Of Fetuses:    1
 Preg. Location:    Intrauterine
 Gest. Sac:         Intrauterine
 Fetal Pole:        Visualized
 Cardiac Activity:  Absent
Biometry

 CRL:     19.7  mm     G. Age:  8w 3d                  EDD:    01/11/15
Gestational Age

 LMP:           6w 5d         Date:  04/18/14                 EDD:   01/23/15
 Best:          6w 5d      Det. By:  LMP  (04/18/14)          EDD:   01/23/15
Cervix Uterus Adnexa

 Cervix:       Normal appearance by transvaginal scan

 Left Ovary:    Within normal limits.
 Right Ovary:   Within normal limits.
Impression

 Nonviable SIUP at 8+3 weeks
 Normal amniotic fluid volume

 Dr. Giorgi informed.
Recommendations

 Follow-up as clinically indicated
 Consider preconception genetic counseling

## 2014-11-19 ENCOUNTER — Emergency Department (HOSPITAL_COMMUNITY): Payer: Medicaid Other

## 2014-11-19 ENCOUNTER — Encounter (HOSPITAL_COMMUNITY): Payer: Self-pay | Admitting: Emergency Medicine

## 2014-11-19 ENCOUNTER — Emergency Department (HOSPITAL_COMMUNITY)
Admission: EM | Admit: 2014-11-19 | Discharge: 2014-11-19 | Disposition: A | Discharge status: Home / Self Care | Payer: Medicaid Other | Attending: Emergency Medicine | Admitting: Emergency Medicine

## 2014-11-19 DIAGNOSIS — S93501A Unspecified sprain of right great toe, initial encounter: Secondary | ICD-10-CM

## 2014-11-19 DIAGNOSIS — R52 Pain, unspecified: Secondary | ICD-10-CM

## 2014-11-19 MED ORDER — NAPROXEN 500 MG PO TABS: 500.0000 mg | ORAL_TABLET | Freq: Two times a day (BID) | ORAL | Status: DC

## 2014-11-19 NOTE — ED Notes (Signed)
Pt. accidentally hit her right great toe against a table this evening , presents with pain / mild swelling .

## 2014-11-19 NOTE — ED Provider Notes (Signed)
CSN: [id number]     Arrival date & time 11/18/14  2352 History   First MD Initiated Contact with Patient 11/19/14 0015     Chief Complaint  Patient presents with  . Toe Injury     (Consider location/radiation/quality/duration/timing/severity/associated sxs/prior Treatment) HPI   38 year old female presents for right great toe injury. Patient reports she accidentally hit her right great toe against the table this evening. Patient reports acute onset of sharp throbbing pain to her toe after injury. She noticed swelling and pain. She denies any other injury. No complaints of numbness. No specific treatment tried. No ankle pain.  Past Medical History  Diagnosis Date  . Medical history non-contributory   . Anxiety    Past Surgical History  Procedure Laterality Date  . Wisdom tooth extraction     No family history on file. History  Substance Use Topics  . Smoking status: Current Every Day Smoker -- 0.50 packs/day for 22 years    Types: Cigarettes  . Smokeless tobacco: Not on file  . Alcohol Use: Yes     Comment: ocassionally   OB History    Gravida Para Term Preterm AB TAB SAB Ectopic Multiple Living   3 1        1      Review of Systems  Constitutional: Negative for fever.  Musculoskeletal: Positive for arthralgias.  Neurological: Negative for numbness.      Allergies  Other; Tomato; and Vicodin  Home Medications   Prior to Admission medications   Medication Sig Start Date End Date Taking? Authorizing Provider  albuterol (PROVENTIL HFA;VENTOLIN HFA) 108 (90 BASE) MCG/ACT inhaler Inhale 2 puffs into the lungs 2 (two) times daily as needed for wheezing or shortness of breath.     Historical Provider, MD  famotidine (PEPCID) 20 MG tablet Take 1 tablet (20 mg total) by mouth 2 (two) times daily. 09/14/14   Kalman Drape, MD  fluticasone (FLONASE) 50 MCG/ACT nasal spray Place 2 sprays into both nostrils daily. 09/14/14   Kalman Drape, MD  ibuprofen (ADVIL,MOTRIN) 200 MG  tablet Take 400 mg by mouth every 6 (six) hours as needed for moderate pain.     Historical Provider, MD  loratadine (CLARITIN) 10 MG tablet Take 10 mg by mouth daily as needed for allergies.    Historical Provider, MD   BP 133/50 mmHg  Pulse 85  Temp(Src) 98.8 F (37.1 C) (Oral)  Resp 14  Ht 5\' 2"  (1.575 m)  Wt 248 lb (112.492 kg)  BMI 45.35 kg/m2  SpO2 97%  LMP 04/18/2014 Physical Exam  Constitutional: She appears well-developed and well-nourished. No distress.  HENT:  Head: Atraumatic.  Eyes: Conjunctivae are normal.  Neck: Neck supple.  Cardiovascular: Intact distal pulses.   Musculoskeletal: She exhibits tenderness (R foot: tenderness to medial aspect of R great toe on palpation without joint involvement, no gross deformity, no bruising. brisk cap refill).  No midfoot tenderness to both feet  Neurological: She is alert.  Skin: No rash noted.  Psychiatric: She has a normal mood and affect.  Nursing note and vitals reviewed.   ED Course  Procedures (including critical care time)  12:42 AM Right great toe injury, no acute fracture based on x-ray. Rice therapy discussed.  Labs Review Labs Reviewed - No data to display  Imaging Review Dg Toe Great Right  11/19/2014   CLINICAL DATA:  Acute onset of right anterior great toe pain, after stubbing toe on wooden table tonight. Initial encounter.  EXAM:  RIGHT GREAT TOE  COMPARISON:  None.  FINDINGS: There is no evidence of fracture or dislocation. The right great toe appears intact. Visualized joint spaces are preserved. No significant soft tissue abnormalities are characterized on radiograph.  IMPRESSION: No evidence of fracture or dislocation.   Electronically Signed   By: Garald Balding M.D.   On: 11/19/2014 00:37     EKG Interpretation None      MDM   Final diagnoses:  Sprain of toe, great, right, initial encounter    BP 133/50 mmHg  Pulse 85  Temp(Src) 98.8 F (37.1 C) (Oral)  Resp 14  Ht 5\' 2"  (1.575 m)  Wt  248 lb (112.492 kg)  BMI 45.35 kg/m2  SpO2 97%  LMP 04/18/2014  I have reviewed nursing notes and vital signs. I personally reviewed the imaging tests through PACS system  I reviewed available ER/hospitalization records thought the EMR     Domenic Moras, PA-C 31/54/00 [id number]  Delora Fuel, MD 19/50/93 [id number]

## 2014-11-19 NOTE — Discharge Instructions (Signed)
You have injured your toe.  No evidence of broken bone on xray.  Take ibuprofen as needed for pain.  Follow instruction below for care.  Sprain A sprain is a tear in one of the strong, fibrous tissues that connect your bones (ligaments). The severity of the sprain depends on how much of the ligament is torn. The tear can be either partial or complete. CAUSES  Often, sprains are a result of a fall or an injury. The force of the impact causes the fibers of your ligament to stretch beyond their normal length. This excess tension causes the fibers of your ligament to tear. SYMPTOMS  You may have some loss of motion or increased pain within your normal range of motion. Other symptoms include:  Bruising.  Tenderness.  Swelling. DIAGNOSIS  In order to diagnose a sprain, your caregiver will physically examine you to determine how torn the ligament is. Your caregiver may also suggest an X-ray exam to make sure no bones are broken. TREATMENT  If your ligament is only partially torn, treatment usually involves keeping the injured area in a fixed position (immobilization) for a short period. To do this, your caregiver will apply a bandage, cast, or splint to keep the area from moving until it heals. For a partially torn ligament, the healing process usually takes 2 to 3 weeks. If your ligament is completely torn, you may need surgery to reconnect the ligament to the bone or to reconstruct the ligament. After surgery, a cast or splint may be applied and will need to stay on for 4 to 6 weeks while your ligament heals. HOME CARE INSTRUCTIONS  Keep the injured area elevated to decrease swelling.  To ease pain and swelling, apply ice to your joint twice a day, for 2 to 3 days.  Put ice in a plastic bag.  Place a towel between your skin and the bag.  Leave the ice on for 15 minutes.  Only take over-the-counter or prescription medicine for pain as directed by your caregiver.  Do not leave the injured  area unprotected until pain and stiffness go away (usually 3 to 4 weeks).  Do not allow your cast or splint to get wet. Cover your cast or splint with a plastic bag when you shower or bathe. Do not swim.  Your caregiver may suggest exercises for you to do during your recovery to prevent or limit permanent stiffness. SEEK IMMEDIATE MEDICAL CARE IF:  Your cast or splint becomes damaged.  Your pain becomes worse. MAKE SURE YOU:  Understand these instructions.  Will watch your condition.  Will get help right away if you are not doing well or get worse. Document Released: 12/03/2000 Document Revised: 02/28/2012 Document Reviewed: 12/18/2011 [facility] Patient Information 2015 [place], [place]. This information is not intended to replace advice given to you by your health care provider. Make sure you discuss any questions you have with your health care provider.

## 2015-02-27 IMAGING — CR DG CHEST 2V
2 series · 2 of 2 positions shown · non-contrast
Comparison: 06/20/2012

CLINICAL DATA: Chest pain.  Cough.  Smoker.

EXAM:
CHEST  2 VIEW

[w chest pa]
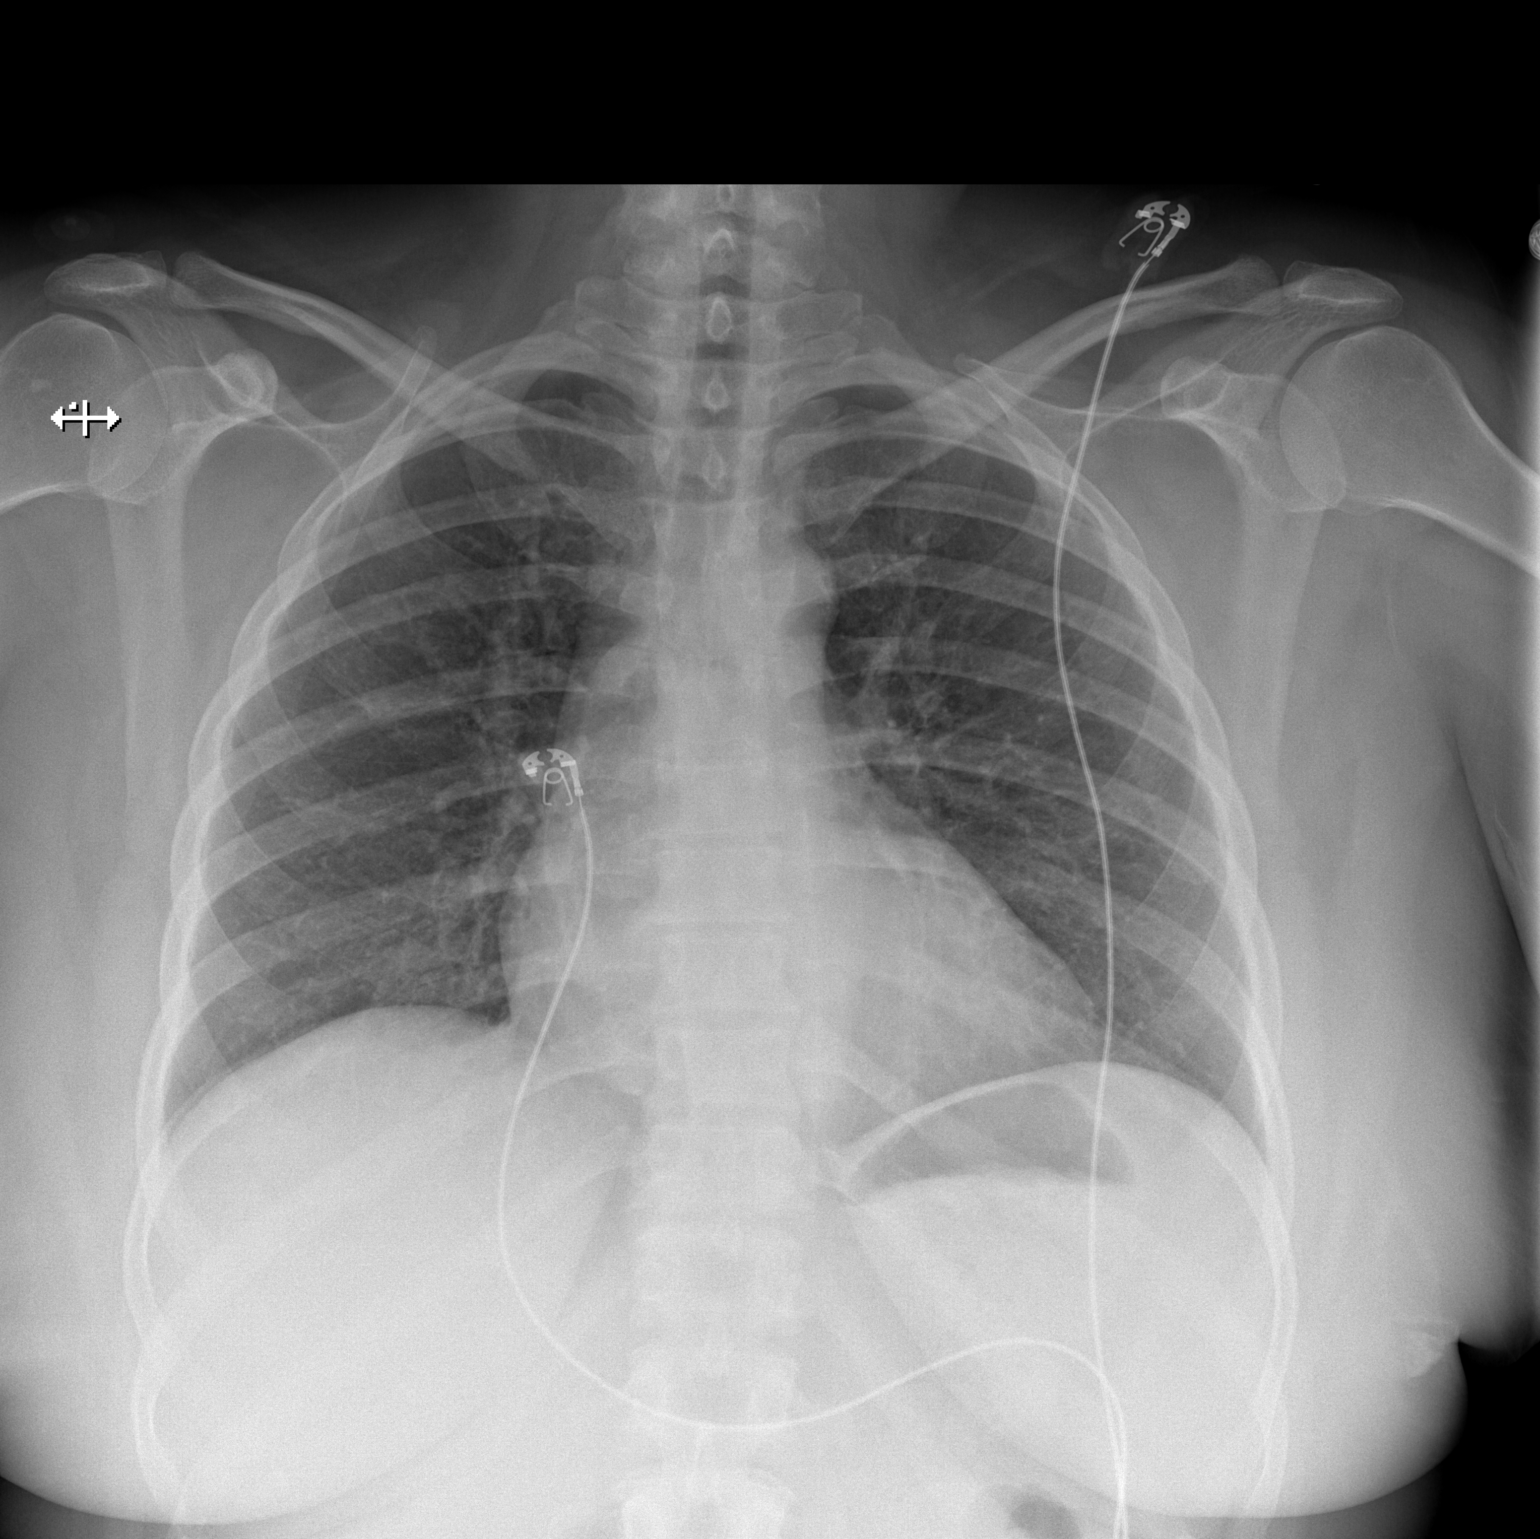

[w chest lat]
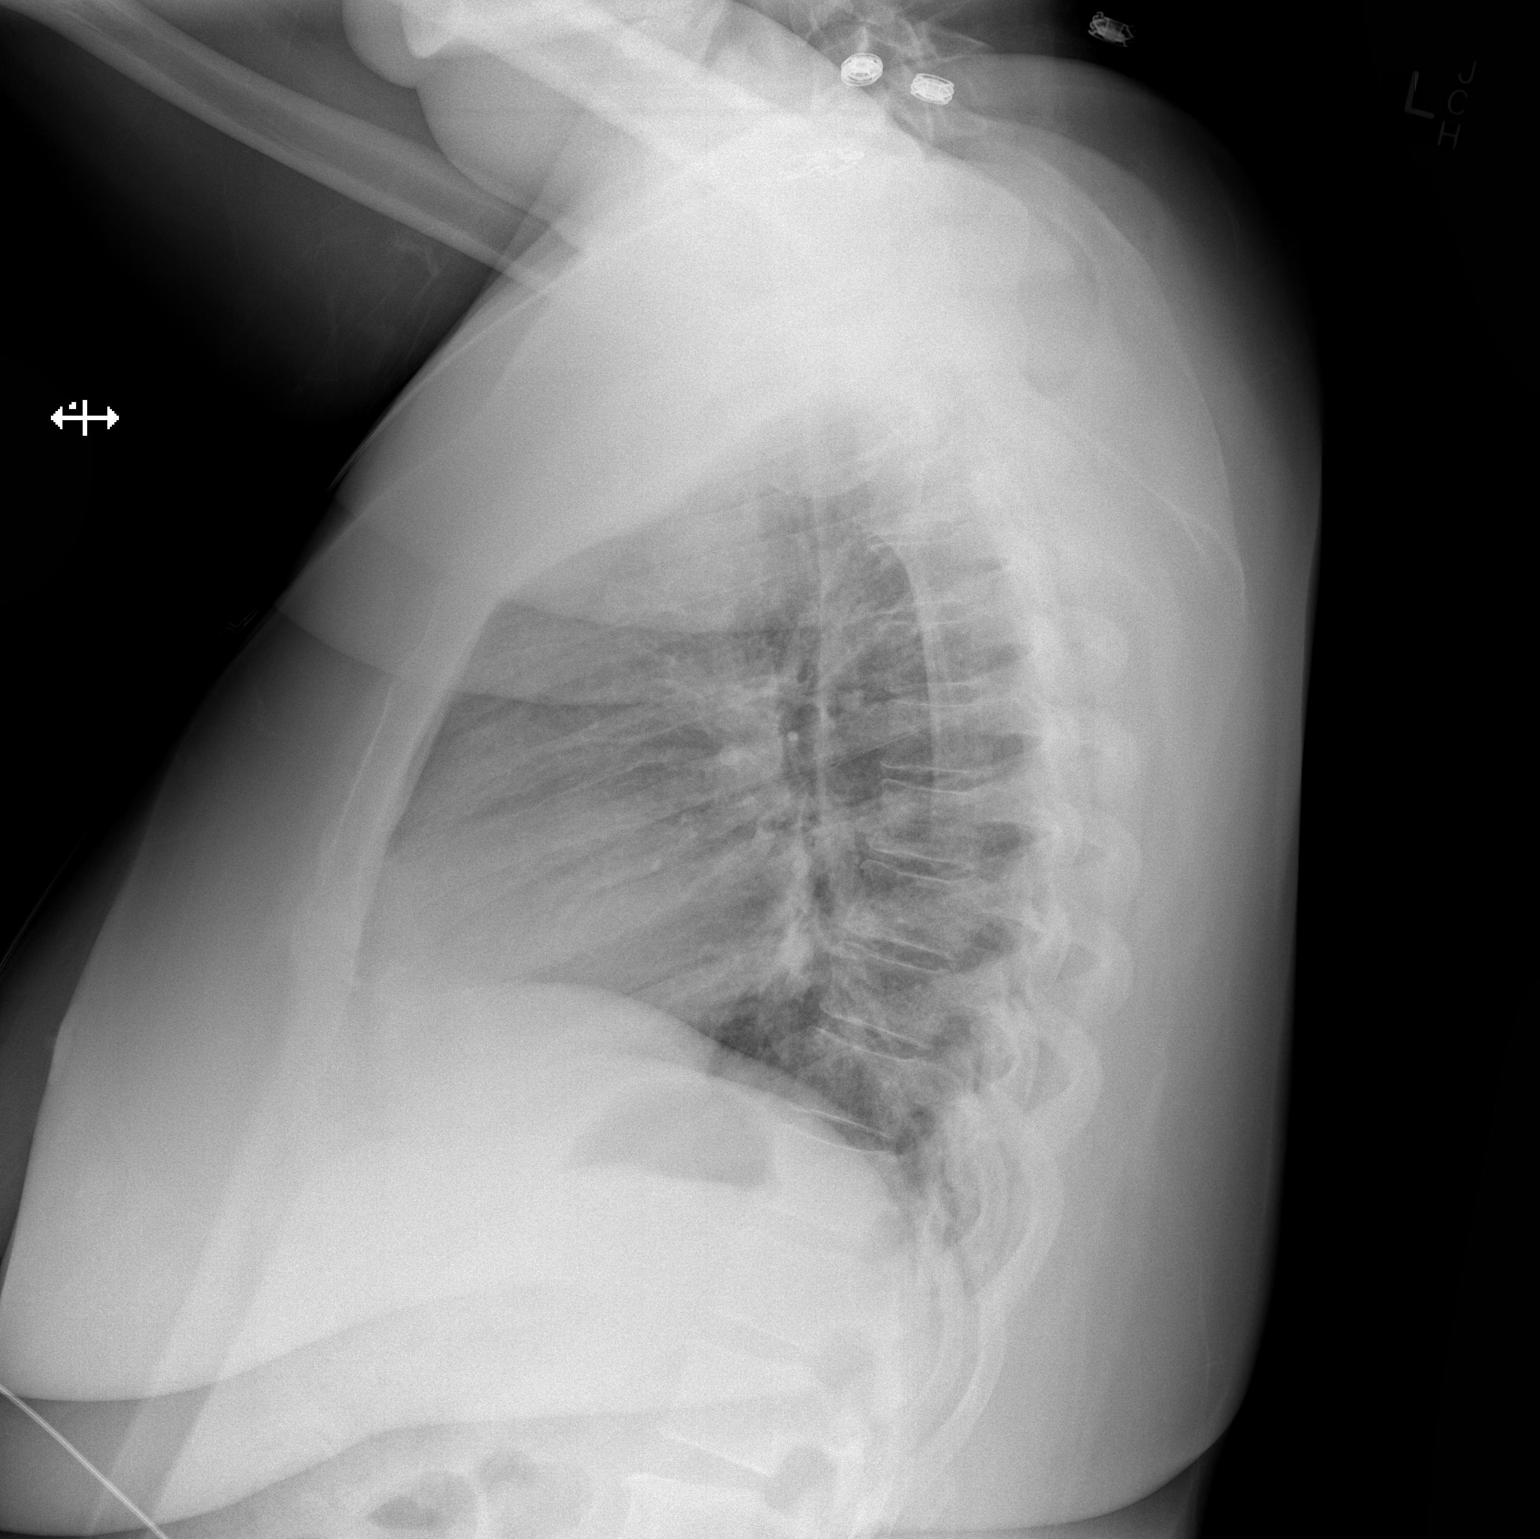

[2 of 2 positions shown; findings below may reference images not displayed]

FINDINGS: The heart size and mediastinal contours are within normal limits.
Both lungs are clear. The visualized skeletal structures are
unremarkable.
IMPRESSION: No active cardiopulmonary disease.

## 2015-03-05 ENCOUNTER — Emergency Department (INDEPENDENT_AMBULATORY_CARE_PROVIDER_SITE_OTHER)
Admission: EM | Admit: 2015-03-05 | Discharge: 2015-03-05 | Disposition: A | Discharge status: Home / Self Care | Payer: Medicaid Other | Source: Home / Self Care | Attending: Family Medicine | Admitting: Family Medicine

## 2015-03-05 ENCOUNTER — Encounter (HOSPITAL_COMMUNITY): Payer: Self-pay | Admitting: *Deleted

## 2015-03-05 DIAGNOSIS — N3001 Acute cystitis with hematuria: Secondary | ICD-10-CM

## 2015-03-05 LAB — POCT URINALYSIS DIP (DEVICE)
Bilirubin Urine: NEGATIVE
Glucose, UA: NEGATIVE mg/dL
Ketones, ur: NEGATIVE mg/dL
Leukocytes, UA: NEGATIVE
Nitrite: NEGATIVE
PH: 6 (ref 5.0–8.0)
Protein, ur: NEGATIVE mg/dL
SPECIFIC GRAVITY, URINE: 1.025 (ref 1.005–1.030)
UROBILINOGEN UA: 0.2 mg/dL (ref 0.0–1.0)

## 2015-03-05 LAB — POCT PREGNANCY, URINE: Preg Test, Ur: NEGATIVE

## 2015-03-05 MED ORDER — CEPHALEXIN 500 MG PO CAPS: 500.0000 mg | ORAL_CAPSULE | Freq: Two times a day (BID) | ORAL | Status: DC

## 2015-03-05 NOTE — Discharge Instructions (Signed)
Thank you for coming in today.  Urinary Tract Infection Urinary tract infections (UTIs) can develop anywhere along your urinary tract. Your urinary tract is your body's drainage system for removing wastes and extra water. Your urinary tract includes two kidneys, two ureters, a bladder, and a urethra. Your kidneys are a pair of bean-shaped organs. Each kidney is about the size of your fist. They are located below your ribs, one on each side of your spine. CAUSES Infections are caused by microbes, which are microscopic organisms, including fungi, viruses, and bacteria. These organisms are so small that they can only be seen through a microscope. Bacteria are the microbes that most commonly cause UTIs. SYMPTOMS  Symptoms of UTIs may vary by age and gender of the patient and by the location of the infection. Symptoms in young women typically include a frequent and intense urge to urinate and a painful, burning feeling in the bladder or urethra during urination. Older women and men are more likely to be tired, shaky, and weak and have muscle aches and abdominal pain. A fever may mean the infection is in your kidneys. Other symptoms of a kidney infection include pain in your back or sides below the ribs, nausea, and vomiting. DIAGNOSIS To diagnose a UTI, your caregiver will ask you about your symptoms. Your caregiver also will ask to provide a urine sample. The urine sample will be tested for bacteria and white blood cells. White blood cells are made by your body to help fight infection. TREATMENT  Typically, UTIs can be treated with medication. Because most UTIs are caused by a bacterial infection, they usually can be treated with the use of antibiotics. The choice of antibiotic and length of treatment depend on your symptoms and the type of bacteria causing your infection. HOME CARE INSTRUCTIONS  If you were prescribed antibiotics, take them exactly as your caregiver instructs you. Finish the medication  even if you feel better after you have only taken some of the medication.  Drink enough water and fluids to keep your urine clear or pale yellow.  Avoid caffeine, tea, and carbonated beverages. They tend to irritate your bladder.  Empty your bladder often. Avoid holding urine for long periods of time.  Empty your bladder before and after sexual intercourse.  After a bowel movement, women should cleanse from front to back. Use each tissue only once. SEEK MEDICAL CARE IF:   You have back pain.  You develop a fever.  Your symptoms do not begin to resolve within 3 days. SEEK IMMEDIATE MEDICAL CARE IF:   You have severe back pain or lower abdominal pain.  You develop chills.  You have nausea or vomiting.  You have continued burning or discomfort with urination. MAKE SURE YOU:   Understand these instructions.  Will watch your condition.  Will get help right away if you are not doing well or get worse. Document Released: 09/15/2005 Document Revised: 06/06/2012 Document Reviewed: 01/14/2012 [facility] Patient Information 2015 [place], [place]. This information is not intended to replace advice given to you by your health care provider. Make sure you discuss any questions you have with your health care provider.

## 2015-03-05 NOTE — ED Notes (Signed)
C/o pain in her lower back onset 3 weeks ago and is getting worse. No known injury.  She does home health care.  No burning but has frequency in small amounts.  States her urine is dark.

## 2015-03-05 NOTE — ED Provider Notes (Signed)
Julie Choi is a 39 y.o. female who presents to Urgent Care today for low back pain with urinary frequency urgency and dysuria present for a few weeks. Symptoms are consistent previous episodes of urinary tract infection. No fevers or chills nausea vomiting or diarrhea. No vaginal discharge.   Past Medical History  Diagnosis Date  . Medical history non-contributory   . Anxiety    Past Surgical History  Procedure Laterality Date  . Wisdom tooth extraction     History  Substance Use Topics  . Smoking status: Current Every Day Smoker -- 0.50 packs/day for 22 years    Types: Cigarettes  . Smokeless tobacco: Not on file  . Alcohol Use: No   ROS as above Medications: No current facility-administered medications for this encounter.   Current Outpatient Prescriptions  Medication Sig Dispense Refill  . fluticasone (FLONASE) 50 MCG/ACT nasal spray Place 2 sprays into both nostrils daily. 9.9 g 0  . albuterol (PROVENTIL HFA;VENTOLIN HFA) 108 (90 BASE) MCG/ACT inhaler Inhale 2 puffs into the lungs 2 (two) times daily as needed for wheezing or shortness of breath.     . cephALEXin (KEFLEX) 500 MG capsule Take 1 capsule (500 mg total) by mouth 2 (two) times daily. 14 capsule 0  . loratadine (CLARITIN) 10 MG tablet Take 10 mg by mouth daily as needed for allergies.    . [DISCONTINUED] famotidine (PEPCID) 20 MG tablet Take 1 tablet (20 mg total) by mouth 2 (two) times daily. 30 tablet 0   Allergies  Allergen Reactions  . Other Hives    From Emeril's spaghetti sauce  . Tomato Hives  . Vicodin [Hydrocodone-Acetaminophen] Other (See Comments)    Paranoia at high doses     Exam:  BP 129/85 mmHg  Pulse 83  Temp(Src) 98.3 F (36.8 C) (Oral)  Resp 16  SpO2 98%  LMP 04/18/2014 Gen: Well NAD HEENT: EOMI,  MMM Lungs: Normal work of breathing. CTABL Heart: RRR no MRG Abd: NABS, Soft. Nondistended, Nontender no CV angle tenderness to percussion Exts: Brisk capillary refill, warm and  well perfused.   Results for orders placed or performed during the hospital encounter of 03/05/15 (from the past 24 hour(s))  POCT urinalysis dip (device)     Status: Abnormal   Collection Time: 03/05/15  7:29 PM  Result Value Ref Range   Glucose, UA NEGATIVE NEGATIVE mg/dL   Bilirubin Urine NEGATIVE NEGATIVE   Ketones, ur NEGATIVE NEGATIVE mg/dL   Specific Gravity, Urine 1.025 1.005 - 1.030   Hgb urine dipstick TRACE (A) NEGATIVE   pH 6.0 5.0 - 8.0   Protein, ur NEGATIVE NEGATIVE mg/dL   Urobilinogen, UA 0.2 0.0 - 1.0 mg/dL   Nitrite NEGATIVE NEGATIVE   Leukocytes, UA NEGATIVE NEGATIVE  Pregnancy, urine POC     Status: None   Collection Time: 03/05/15  7:39 PM  Result Value Ref Range   Preg Test, Ur NEGATIVE NEGATIVE   No results found.  Assessment and Plan: 39 y.o. female with probable UTI. Culture pending treat with Keflex.  Discussed warning signs or symptoms. Please see discharge instructions. Patient expresses understanding.     Gregor Hams, MD 03/05/15 2010

## 2015-03-07 LAB — URINE CULTURE
Colony Count: 40000
Special Requests: NORMAL

## 2015-05-21 ENCOUNTER — Emergency Department (HOSPITAL_COMMUNITY)
Admission: EM | Admit: 2015-05-21 | Discharge: 2015-05-21 | Disposition: A | Discharge status: Home / Self Care | Payer: Medicaid Other | Attending: Emergency Medicine | Admitting: Emergency Medicine

## 2015-05-21 ENCOUNTER — Encounter (HOSPITAL_COMMUNITY): Payer: Self-pay | Admitting: *Deleted

## 2015-05-21 DIAGNOSIS — Z8659 Personal history of other mental and behavioral disorders: Secondary | ICD-10-CM | POA: Insufficient documentation

## 2015-05-21 DIAGNOSIS — L509 Urticaria, unspecified: Secondary | ICD-10-CM | POA: Insufficient documentation

## 2015-05-21 DIAGNOSIS — Y929 Unspecified place or not applicable: Secondary | ICD-10-CM | POA: Insufficient documentation

## 2015-05-21 DIAGNOSIS — Z720 Tobacco use: Secondary | ICD-10-CM | POA: Insufficient documentation

## 2015-05-21 DIAGNOSIS — Z7951 Long term (current) use of inhaled steroids: Secondary | ICD-10-CM | POA: Insufficient documentation

## 2015-05-21 DIAGNOSIS — Z792 Long term (current) use of antibiotics: Secondary | ICD-10-CM | POA: Insufficient documentation

## 2015-05-21 DIAGNOSIS — Y939 Activity, unspecified: Secondary | ICD-10-CM | POA: Insufficient documentation

## 2015-05-21 DIAGNOSIS — S40862A Insect bite (nonvenomous) of left upper arm, initial encounter: Secondary | ICD-10-CM

## 2015-05-21 DIAGNOSIS — S50362A Insect bite (nonvenomous) of left elbow, initial encounter: Secondary | ICD-10-CM | POA: Insufficient documentation

## 2015-05-21 DIAGNOSIS — X58XXXA Exposure to other specified factors, initial encounter: Secondary | ICD-10-CM | POA: Insufficient documentation

## 2015-05-21 DIAGNOSIS — Y999 Unspecified external cause status: Secondary | ICD-10-CM | POA: Insufficient documentation

## 2015-05-21 DIAGNOSIS — W57XXXA Bitten or stung by nonvenomous insect and other nonvenomous arthropods, initial encounter: Secondary | ICD-10-CM

## 2015-05-21 MED ORDER — CEPHALEXIN 500 MG PO CAPS: ORAL_CAPSULE | ORAL | Status: DC

## 2015-05-21 NOTE — Discharge Instructions (Signed)
Take antibiotic as directed in order to prevent any infection that may be caused by the insect bite. You may consider using over the counter antihistamines and hydrocortisone lotion to help with itching or burning. Use ice as needed for additional relief. Continue your usual home medications. Get plenty of rest and drink plenty of fluids. Avoid any known triggers. Please followup with your primary doctor in 2 days for wound recheck and ongoing care. Return to the ER for changes or worsening symptoms.   Insect Bite Mosquitoes, flies, fleas, bedbugs, and other insects can bite. Insect bites are different from insect stings. The bite may be red, puffy (swollen), and itchy for 2 to 4 days. Most bites get better on their own. HOME CARE   Do not scratch the bite.  Keep the bite clean and dry. Wash the bite with soap and water.  Put ice on the bite.  Put ice in a plastic bag.  Place a towel between your skin and the bag.  Leave the ice on for 20 minutes, 4 times a day. Do this for the first 2 to 3 days, or as told by your doctor.  You may use medicated lotions or creams to lessen itching as told by your doctor.  Only take medicines as told by your doctor.  If you are given medicines (antibiotics), take them as told. Finish them even if you start to feel better. You may need a tetanus shot if:  You cannot remember when you had your last tetanus shot.  You have never had a tetanus shot.  The injury broke your skin. If you need a tetanus shot and you choose not to have one, you may get tetanus. Sickness from tetanus can be serious. GET HELP RIGHT AWAY IF:   You have more pain, redness, or puffiness.  You see a red line on the skin coming from the bite.  You have a fever.  You have joint pain.  You have a headache or neck pain.  You feel weak.  You have a rash.  You have chest pain, or you are short of breath.  You have belly (abdominal) pain.  You feel sick to your stomach  (nauseous) or throw up (vomit).  You feel very tired or sleepy. MAKE SURE YOU:   Understand these instructions.  Will watch your condition.  Will get help right away if you are not doing well or get worse. Document Released: 12/03/2000 Document Revised: 02/28/2012 Document Reviewed: 07/07/2011 [facility] Patient Information 2015 [place], [place]. This information is not intended to replace advice given to you by your health care provider. Make sure you discuss any questions you have with your health care provider.

## 2015-05-21 NOTE — ED Provider Notes (Signed)
CSN: [id number]     Arrival date & time 05/21/15  1801 History  This chart was scribed for [employer], PA-C, working with Fredia Sorrow, MD by Steva Colder, ED Scribe. The patient was seen in room TR07C/TR07C at 6:15 PM.    Chief Complaint  Patient presents with  . Insect Bite      Patient is a 39 y.o. female presenting with animal bite. The history is provided by the patient. No language interpreter was used.  Animal Bite Contact animal:  Insect Location:  Shoulder/arm Shoulder/arm injury location:  L elbow Time since incident: this morning. Pain details:    Quality:  Burning   Severity:  Mild   Timing:  Constant   Progression:  Unchanged Incident location:  Unable to specify Provoked: unprovoked   Notifications:  None Tetanus status:  Up to date Relieved by:  Nothing Worsened by:  Nothing tried Ineffective treatments: alcohol and peroxide. Associated symptoms: swelling   Associated symptoms: no fever and no numbness      Julie Choi is a 39 y.o. female with a medical hx of anxiety who presents to the Emergency department complaining of insect bite onset this morning. Pt reports that the area burns without pain or itching and she is unsure if she was bit by anything. Pt denies the area spreading anywhere else. Pt also notes that there is clear drainage from the area. Pt went outside earlier but was not in wooden areas. Pt states the burning is 3-4/10, constant, located at the insect bite area to L elbow, and it does not radiate. She reports that nothing in particular makes the symptoms worse. She states that she has tried alcohol and peroxide with no relief for her symptoms. She states that she is having associated symptoms of redness.  She denies numbness, tingling, weakness, tongue/lip swelling, SOB, CP, fevers, chills, and any other symptoms. Pt denies medical hx of DM. Denies sick contacts or exposure to similar rash. Pt reports that she does have a PCP.     Past Medical History  Diagnosis Date  . Medical history non-contributory   . Anxiety    Past Surgical History  Procedure Laterality Date  . Wisdom tooth extraction     History reviewed. No pertinent family history. History  Substance Use Topics  . Smoking status: Current Every Day Smoker -- 0.50 packs/day for 22 years    Types: Cigarettes  . Smokeless tobacco: Not on file  . Alcohol Use: No   OB History    Gravida Para Term Preterm AB TAB SAB Ectopic Multiple Living   3 1        1      Review of Systems  Constitutional: Negative for fever and chills.  HENT: Negative for facial swelling.   Respiratory: Negative for shortness of breath.   Cardiovascular: Negative for chest pain.  Skin: Positive for color change and wound.  Allergic/Immunologic: Negative for immunocompromised state.  Neurological: Negative for weakness and numbness.  Hematological: Does not bruise/bleed easily.    A complete 10 system review of systems was obtained and all systems are negative except as noted in the HPI and PMH.    Allergies  Other; Tomato; and Vicodin  Home Medications   Prior to Admission medications   Medication Sig Start Date End Date Taking? Authorizing Provider  albuterol (PROVENTIL HFA;VENTOLIN HFA) 108 (90 BASE) MCG/ACT inhaler Inhale 2 puffs into the lungs 2 (two) times daily as needed for wheezing or shortness of breath.  Historical Provider, MD  cephALEXin (KEFLEX) 500 MG capsule Take 1 capsule (500 mg total) by mouth 2 (two) times daily. 03/05/15   Gregor Hams, MD  fluticasone (FLONASE) 50 MCG/ACT nasal spray Place 2 sprays into both nostrils daily. 09/14/14   Linton Flemings, MD  loratadine (CLARITIN) 10 MG tablet Take 10 mg by mouth daily as needed for allergies.    Historical Provider, MD   BP 117/74 mmHg  Pulse 85  Temp(Src) 98.2 F (36.8 C) (Oral)  Resp 16  SpO2 100% Physical Exam  Constitutional: She is oriented to person, place, and time. Vital signs are  normal. She appears well-developed and well-nourished.  Non-toxic appearance. No distress.  Afebrile, nontoxic, NAD  HENT:  Head: Normocephalic and atraumatic.  Mouth/Throat: Mucous membranes are normal.  Eyes: Conjunctivae and EOM are normal. Right eye exhibits no discharge. Left eye exhibits no discharge.  Neck: Normal range of motion. Neck supple.  Cardiovascular: Normal rate and intact distal pulses.   Pulmonary/Chest: Effort normal. No respiratory distress.  Abdominal: Normal appearance. She exhibits no distension.  Musculoskeletal: Normal range of motion.  L elbow with FROM intact, no swelling or deformity MAE x4 Strength and sensation grossly intact Distal pulses intact  Neurological: She is alert and oriented to person, place, and time. She has normal strength. No sensory deficit.  Skin: Skin is warm, dry and intact. Rash noted. Rash is urticarial. There is erythema.  One insect bite to L elbow, with some surrounding erythema in an urticarial type lesion, with central bite noted to have some slight swelling and induration, no fluctuance or drainage, no target lesions.   Psychiatric: She has a normal mood and affect. Her behavior is normal.  Nursing note and vitals reviewed.   ED Course  Procedures (including critical care time) DIAGNOSTIC STUDIES: Oxygen Saturation is 100% on RA, nl by my interpretation.    COORDINATION OF CARE: 6:20 PM-Discussed treatment plan which includes abx Rx, anti-histamines, and f/u with PCP in 2 days with pt at bedside and pt agreed to plan.   Labs Review Labs Reviewed - No data to display  Imaging Review No results found.   EKG Interpretation None      MDM   Final diagnoses:  Insect bite of arm, left, initial encounter  Urticaria    39 y.o. female here with insect bite to L elbow. Urticaria with mildly indurated central insect bite. No pustules or drainage, no surrounding cellulitis, no fluctuance. Will start on keflex as precaution  to avoid any possible infection that may occur, but likely this is an allergic type reaction to insect bite. Discussed antihistamine use, ice, and antiitch lotions as needed. Will have her f/up with PCP in 2 days for recheck to see if this area becomes an abscess or worsens. I explained the diagnosis and have given explicit precautions to return to the ER including for any other new or worsening symptoms. The patient understands and accepts the medical plan as it's been dictated and I have answered their questions. Discharge instructions concerning home care and prescriptions have been given. The patient is STABLE and is discharged to home in good condition.   I personally performed the services described in this documentation, which was scribed in my presence. The recorded information has been reviewed and is accurate.  Meds ordered this encounter  Medications  . cephALEXin (KEFLEX) 500 MG capsule    Sig: 2 caps po bid x 7 days    Dispense:  28 capsule  Refill:  0    Order Specific Question:  Supervising Provider    Answer:  Noemi Chapel [[id number]]      Lurleen Soltero Camprubi-Soms, PA-C 05/21/15 1843  Fredia Sorrow, MD 05/22/15 1640

## 2015-05-21 NOTE — ED Notes (Signed)
Declined W/C at D/C and was escorted to lobby by RN. 

## 2015-05-21 NOTE — ED Notes (Signed)
Pt reports the red swollen area started after she took a shower this AM.

## 2016-04-26 ENCOUNTER — Emergency Department (HOSPITAL_COMMUNITY)
Admission: EM | Admit: 2016-04-26 | Discharge: 2016-04-27 | Disposition: A | Discharge status: Home / Self Care | Payer: Medicaid Other | Attending: Emergency Medicine | Admitting: Emergency Medicine

## 2016-04-26 DIAGNOSIS — F419 Anxiety disorder, unspecified: Secondary | ICD-10-CM | POA: Insufficient documentation

## 2016-04-26 DIAGNOSIS — Z79899 Other long term (current) drug therapy: Secondary | ICD-10-CM | POA: Insufficient documentation

## 2016-04-26 DIAGNOSIS — L259 Unspecified contact dermatitis, unspecified cause: Secondary | ICD-10-CM

## 2016-04-26 DIAGNOSIS — F17210 Nicotine dependence, cigarettes, uncomplicated: Secondary | ICD-10-CM | POA: Insufficient documentation

## 2016-04-26 DIAGNOSIS — Z792 Long term (current) use of antibiotics: Secondary | ICD-10-CM | POA: Insufficient documentation

## 2016-04-27 ENCOUNTER — Encounter (HOSPITAL_COMMUNITY): Payer: Self-pay | Admitting: Emergency Medicine

## 2016-04-27 MED ORDER — HYDROXYZINE HCL 25 MG PO TABS
25.0000 mg | ORAL_TABLET | Freq: Once | ORAL | Status: AC
  Administered 2016-04-27: 25 mg via ORAL
  Filled 2016-04-27: qty 1

## 2016-04-27 MED ORDER — PREDNISONE 20 MG PO TABS: 40.0000 mg | ORAL_TABLET | Freq: Every day | ORAL | Status: DC

## 2016-04-27 MED ORDER — DEXAMETHASONE SODIUM PHOSPHATE 10 MG/ML IJ SOLN
10.0000 mg | Freq: Once | INTRAMUSCULAR | Status: AC
  Administered 2016-04-27: 10 mg via INTRAMUSCULAR
  Filled 2016-04-27: qty 1

## 2016-04-27 MED ORDER — HYDROXYZINE HCL 25 MG PO TABS: 25.0000 mg | ORAL_TABLET | Freq: Four times a day (QID) | ORAL | Status: DC

## 2016-04-27 NOTE — ED Provider Notes (Signed)
CSN: [id number]     Arrival date & time 04/26/16  2347 History   First MD Initiated Contact with Patient 04/27/16 [phone]     Chief Complaint  Patient presents with  . Rash     (Consider location/radiation/quality/duration/timing/severity/associated sxs/prior Treatment) Patient is a 40 y.o. female presenting with rash. The history is provided by the patient. No language interpreter was used.  Rash Location:  Torso and leg Torso rash location:  Upper back, abd RLQ and abd LLQ Leg rash location:  L lower leg, R lower leg, R upper leg and L upper leg Quality: itchiness and redness   Quality: not bruising, not burning, not painful and not scaling   Severity:  Moderate Onset quality:  Gradual Duration:  6 days Timing:  Constant Progression:  Spreading Chronicity:  New Context comment:  Onset 3 days after cutting grass; started on forearms and spread to body Relieved by:  Nothing Ineffective treatments: Topical bleach and benadryl. Associated symptoms: no fever, no periorbital edema, no shortness of breath, no throat swelling, no tongue swelling, not vomiting and not wheezing     Past Medical History  Diagnosis Date  . Medical history non-contributory   . Anxiety    Past Surgical History  Procedure Laterality Date  . Wisdom tooth extraction     History reviewed. No pertinent family history. Social History  Substance Use Topics  . Smoking status: Current Every Day Smoker -- 0.50 packs/day for 22 years    Types: Cigarettes  . Smokeless tobacco: None  . Alcohol Use: No   OB History    Gravida Para Term Preterm AB TAB SAB Ectopic Multiple Living   3 1        1       Review of Systems  Constitutional: Negative for fever.  HENT: Negative for facial swelling and trouble swallowing.   Respiratory: Negative for cough, shortness of breath and wheezing.   Gastrointestinal: Negative for vomiting.  Skin: Positive for rash.  All other systems reviewed and are negative.   Allergies   Other; Tomato; and Vicodin  Home Medications   Prior to Admission medications   Medication Sig Start Date End Date Taking? Authorizing Provider  albuterol (PROVENTIL HFA;VENTOLIN HFA) 108 (90 BASE) MCG/ACT inhaler Inhale 2 puffs into the lungs 2 (two) times daily as needed for wheezing or shortness of breath.     Historical Provider, MD  cephALEXin (KEFLEX) 500 MG capsule Take 1 capsule (500 mg total) by mouth 2 (two) times daily. 03/05/15   Gregor Hams, MD  cephALEXin (KEFLEX) 500 MG capsule 2 caps po bid x 7 days 05/21/15   Mercedes Camprubi-Soms, PA-C  fluticasone (FLONASE) 50 MCG/ACT nasal spray Place 2 sprays into both nostrils daily. 09/14/14   Linton Flemings, MD  hydrOXYzine (ATARAX/VISTARIL) 25 MG tablet Take 1 tablet (25 mg total) by mouth every 6 (six) hours. 04/27/16   Antonietta Breach, PA-C  loratadine (CLARITIN) 10 MG tablet Take 10 mg by mouth daily as needed for allergies.    Historical Provider, MD  predniSONE (DELTASONE) 20 MG tablet Take 2 tablets (40 mg total) by mouth daily. Take 40 mg by mouth daily for 3 days, then 20mg  by mouth daily for 3 days, then 10mg  daily for 3 days 04/27/16   Antonietta Breach, PA-C   BP 129/92 mmHg  Pulse 98  Temp(Src) 98.4 F (36.9 C) (Oral)  Resp 18  SpO2 98%   Physical Exam  Constitutional: She is oriented to person, place, and time.  She appears well-developed and well-nourished. No distress.  Nontoxic appearing and in no distress  HENT:  Head: Normocephalic and atraumatic.  Eyes: Conjunctivae and EOM are normal. No scleral icterus.  Neck: Normal range of motion.  Pulmonary/Chest: Effort normal. No respiratory distress. She has no wheezes.  Respirations even and unlabored. No stridor.  Musculoskeletal: Normal range of motion.  Neurological: She is alert and oriented to person, place, and time. She exhibits normal muscle tone. Coordination normal.  Patient ambulatory with steady gait.  Skin: Skin is warm and dry. Rash noted. She is not diaphoretic. No  erythema. No pallor.  Papular rash noted to the dorsal aspects of bilateral forearms, upper back, lower abdomen, and bilateral lower extremities. No vesicles appreciated. No significant weeping. Rash is pruritic and mildly erythematous; blanching. No skin sloughing or peeling. No bullae.  Psychiatric: She has a normal mood and affect. Her behavior is normal.  Nursing note and vitals reviewed.   ED Course  Procedures (including critical care time) Labs Review Labs Reviewed - No data to display  Imaging Review No results found. I have personally reviewed and evaluated these images and lab results as part of my medical decision-making.   EKG Interpretation None      MDM   Final diagnoses:  Contact dermatitis    Patient presents for rash consistent with contact dermatitis. Suspect plant-based etiology. No concern for erythema multiforme major, erythema multiforme minor, or SJS. No facial swelling or angioedema. No respiratory involvement or hypoxia. Rash is pruritic. Patient to be given a prescription for Atarax. Will also start on a prednisone taper. OTC Zanfel advised in addition to outpatient PCP f/u. Return precautions discussed and provided. Patient discharged in satisfactory condition.   Filed Vitals:   04/26/16 2359 04/27/16 0438  BP: 139/95 129/92  Pulse: 47 98  Temp: 98.4 F (36.9 C)   TempSrc: Oral   Resp: 16 18  SpO2: 99% 98%     Antonietta Breach, PA-C 04/27/16 [place], MD 04/29/16 0002

## 2016-04-27 NOTE — ED Notes (Signed)
Pt states that 2 weeks ago she was mowing her yard and she now has a rash about her body that she thinks is poison ivy. States she hasn't had this in some time. Tried benadryl without relief. Alert and oriented.

## 2016-04-27 NOTE — ED Notes (Signed)
Bed: WA06 Expected date:  Expected time:  Means of arrival:  Comments: 

## 2016-04-27 NOTE — Discharge Instructions (Signed)
We recommend that you take prednisone as prescribed until finished. You may take Atarax as prescribed for itching. You may also try over-the-counter hydrocortisone cream and apply to the rash topically. Do not apply hydrocortisone cream to your face. You may benefit from [place]. This can be found over-the-counter at your local pharmacy or [place]. Follow up in your primary care doctor for a recheck of symptoms.  Contact Dermatitis Dermatitis is redness, soreness, and swelling (inflammation) of the skin. Contact dermatitis is a reaction to certain substances that touch the skin. There are two types of contact dermatitis:   Irritant contact dermatitis. This type is caused by something that irritates your skin, such as dry hands from washing them too much. This type does not require previous exposure to the substance for a reaction to occur. This type is more common.  Allergic contact dermatitis. This type is caused by a substance that you are allergic to, such as a nickel allergy or poison ivy. This type only occurs if you have been exposed to the substance (allergen) before. Upon a repeat exposure, your body reacts to the substance. This type is less common. CAUSES  Many different substances can cause contact dermatitis. Irritant contact dermatitis is most commonly caused by exposure to:   Makeup.   Soaps.   Detergents.   Bleaches.   Acids.   Metal salts, such as nickel.  Allergic contact dermatitis is most commonly caused by exposure to:   Poisonous plants.   Chemicals.   Jewelry.   Latex.   Medicines.   Preservatives in products, such as clothing.  RISK FACTORS This condition is more likely to develop in:   People who have jobs that expose them to irritants or allergens.  People who have certain medical conditions, such as asthma or eczema.  SYMPTOMS  Symptoms of this condition may occur anywhere on your body where the irritant has touched you or is touched by  you. Symptoms include:  Dryness or flaking.   Redness.   Cracks.   Itching.   Pain or a burning feeling.   Blisters.  Drainage of small amounts of blood or clear fluid from skin cracks. With allergic contact dermatitis, there may also be swelling in areas such as the eyelids, mouth, or genitals.  DIAGNOSIS  This condition is diagnosed with a medical history and physical exam. A patch skin test may be performed to help determine the cause. If the condition is related to your job, you may need to see an occupational medicine specialist. TREATMENT Treatment for this condition includes figuring out what caused the reaction and protecting your skin from further contact. Treatment may also include:   Steroid creams or ointments. Oral steroid medicines may be needed in more severe cases.  Antibiotics or antibacterial ointments, if a skin infection is present.  Antihistamine lotion or an antihistamine taken by mouth to ease itching.  A bandage (dressing). HOME CARE INSTRUCTIONS Skin Care  Moisturize your skin as needed.   Apply cool compresses to the affected areas.  Try taking a bath with:  Epsom salts. Follow the instructions on the packaging. You can get these at your local pharmacy or grocery store.  Baking soda. Pour a small amount into the bath as directed by your health care provider.  Colloidal oatmeal. Follow the instructions on the packaging. You can get this at your local pharmacy or grocery store.  Try applying baking soda paste to your skin. Stir water into baking soda until it reaches a paste-like  consistency.  Do not scratch your skin.  Bathe less frequently, such as every other day.  Bathe in lukewarm water. Avoid using hot water. Medicines  Take or apply over-the-counter and prescription medicines only as told by your health care provider.   If you were prescribed an antibiotic medicine, take or apply your antibiotic as told by your health care  provider. Do not stop using the antibiotic even if your condition starts to improve. General Instructions  Keep all follow-up visits as told by your health care provider. This is important.  Avoid the substance that caused your reaction. If you do not know what caused it, keep a journal to try to track what caused it. Write down:  What you eat.  What cosmetic products you use.  What you drink.  What you wear in the affected area. This includes jewelry.  If you were given a dressing, take care of it as told by your health care provider. This includes when to change and remove it. SEEK MEDICAL CARE IF:   Your condition does not improve with treatment.  Your condition gets worse.  You have signs of infection such as swelling, tenderness, redness, soreness, or warmth in the affected area.  You have a fever.  You have new symptoms. SEEK IMMEDIATE MEDICAL CARE IF:   You have a severe headache, neck pain, or neck stiffness.  You vomit.  You feel very sleepy.  You notice red streaks coming from the affected area.  Your bone or joint underneath the affected area becomes painful after the skin has healed.  The affected area turns darker.  You have difficulty breathing.   This information is not intended to replace advice given to you by your health care provider. Make sure you discuss any questions you have with your health care provider.   Document Released: 12/03/2000 Document Revised: 08/27/2015 Document Reviewed: 04/23/2015 Elsevier Interactive Patient Education [employer].

## 2016-06-17 ENCOUNTER — Ambulatory Visit (HOSPITAL_COMMUNITY)
Admission: EM | Admit: 2016-06-17 | Discharge: 2016-06-17 | Disposition: A | Discharge status: Home / Self Care | Payer: Self-pay | Attending: Family Medicine | Admitting: Family Medicine

## 2016-06-17 ENCOUNTER — Encounter (HOSPITAL_COMMUNITY): Payer: Self-pay | Admitting: Emergency Medicine

## 2016-06-17 DIAGNOSIS — N390 Urinary tract infection, site not specified: Secondary | ICD-10-CM

## 2016-06-17 MED ORDER — PHENAZOPYRIDINE HCL 200 MG PO TABS: 200.0000 mg | ORAL_TABLET | Freq: Three times a day (TID) | ORAL | Status: DC

## 2016-06-17 MED ORDER — CIPROFLOXACIN HCL 500 MG PO TABS: 500.0000 mg | ORAL_TABLET | Freq: Two times a day (BID) | ORAL | Status: DC

## 2016-06-17 NOTE — ED Notes (Signed)
Here for UTI sx onset 2 days Sx today include: dysuria, urinary freq, foul odor, hematuria and back pain Denies fevers, chills A&O x4... NAD

## 2016-06-17 NOTE — ED Provider Notes (Signed)
CSN: [id number]     Arrival date & time 06/17/16  1727 History   None    Chief Complaint  Patient presents with  . Urinary Tract Infection   (Consider location/radiation/quality/duration/timing/severity/associated sxs/prior Treatment) Patient is a 40 y.o. female presenting with urinary tract infection. The history is provided by the patient.  Urinary Tract Infection Pain quality:  Aching Pain severity:  Moderate Timing:  Constant Progression:  Worsening Chronicity:  New Recent urinary tract infections: no   Relieved by:  Nothing Worsened by:  Nothing tried Ineffective treatments:  None tried Urinary symptoms: foul-smelling urine, frequent urination and hematuria   Associated symptoms: abdominal pain     Past Medical History  Diagnosis Date  . Medical history non-contributory   . Anxiety    Past Surgical History  Procedure Laterality Date  . Wisdom tooth extraction     No family history on file. Social History  Substance Use Topics  . Smoking status: Current Every Day Smoker -- 0.50 packs/day for 22 years    Types: Cigarettes  . Smokeless tobacco: None  . Alcohol Use: No   OB History    Gravida Para Term Preterm AB TAB SAB Ectopic Multiple Living   3 1        1      Review of Systems  Constitutional: Negative.   HENT: Negative.   Eyes: Negative.   Respiratory: Negative.   Cardiovascular: Negative.   Gastrointestinal: Positive for abdominal pain.  Endocrine: Negative.   Genitourinary: Positive for dysuria, urgency, frequency and hematuria.  Musculoskeletal: Negative.   Skin: Negative.   Allergic/Immunologic: Negative.   Neurological: Negative.   Hematological: Negative.   Psychiatric/Behavioral: Negative.     Allergies  Other; Tomato; and Vicodin  Home Medications   Prior to Admission medications   Medication Sig Start Date End Date Taking? Authorizing Provider  albuterol (PROVENTIL HFA;VENTOLIN HFA) 108 (90 BASE) MCG/ACT inhaler Inhale 2 puffs into  the lungs 2 (two) times daily as needed for wheezing or shortness of breath.     Historical Provider, MD  cephALEXin (KEFLEX) 500 MG capsule Take 1 capsule (500 mg total) by mouth 2 (two) times daily. 03/05/15   Gregor Hams, MD  cephALEXin (KEFLEX) 500 MG capsule 2 caps po bid x 7 days 05/21/15   Mercedes Camprubi-Soms, PA-C  fluticasone (FLONASE) 50 MCG/ACT nasal spray Place 2 sprays into both nostrils daily. 09/14/14   Linton Flemings, MD  hydrOXYzine (ATARAX/VISTARIL) 25 MG tablet Take 1 tablet (25 mg total) by mouth every 6 (six) hours. 04/27/16   Antonietta Breach, PA-C  loratadine (CLARITIN) 10 MG tablet Take 10 mg by mouth daily as needed for allergies.    Historical Provider, MD  predniSONE (DELTASONE) 20 MG tablet Take 2 tablets (40 mg total) by mouth daily. Take 40 mg by mouth daily for 3 days, then 20mg  by mouth daily for 3 days, then 10mg  daily for 3 days 04/27/16   Antonietta Breach, PA-C   Meds Ordered and Administered this Visit  Medications - No data to display  BP 141/90 mmHg  Pulse 87  Temp(Src) 98.6 F (37 C) (Oral)  Resp 16  SpO2 100% No data found.   Physical Exam  Constitutional: She appears well-developed and well-nourished.  HENT:  Head: Normocephalic.  Right Ear: External ear normal.  Left Ear: External ear normal.  Mouth/Throat: Oropharynx is clear and moist.  Eyes: Conjunctivae and EOM are normal. Pupils are equal, round, and reactive to light.  Neck: Normal range of  motion. Neck supple.  Cardiovascular: Normal rate, regular rhythm and normal heart sounds.   Pulmonary/Chest: Effort normal and breath sounds normal.  Abdominal: Soft. Bowel sounds are normal.    ED Course  Procedures (including critical care time)  Labs Review Labs Reviewed - No data to display  Imaging Review No results found.   Visual Acuity Review  Right Eye Distance:   Left Eye Distance:   Bilateral Distance:    Right Eye Near:   Left Eye Near:    Bilateral Near:         MDM   UTI Dysuria  Cipro 500mg  one po bid x 10 days #20 Pyridium 200mg  one po tid #6  Urine dipstick with 3 plus leukocytes, 3 plus nitrates, 3 plus blood Urine Culture is ordered.  Patient advised Push po fluids, rest, tylenol and motrin otc prn as directed for fever, arthralgias, and myalgias.  Follow up prn if sx's continue or persist.   Lysbeth Penner, FNP 06/17/16 1827

## 2016-06-17 NOTE — Discharge Instructions (Signed)
Antibiotic Medicine °Antibiotic medicines are used to treat infections caused by bacteria. They work by injuring or killing the bacteria that is making you sick. °HOW IS AN ANTIBIOTIC CHOSEN? °An antibiotic is chosen based on many factors. To help your health care provider choose one for you, tell your health care provider if: °· You have any allergies. °· You are pregnant or plan to get pregnant. °· You are breastfeeding. °· You are taking any medicines. These include over-the-counter medicines, prescription medicines, and herbal remedies. °· You have a medical condition or problem you have not already discussed. °Your health care provider will also consider: °· How often the medicine has to be taken. °· Common side effects of the medicine. °· The cost of the medicine. °· The taste of the medicine. °If you have questions about why an antibiotic was chosen, make sure to ask. °FOR HOW LONG SHOULD I TAKE MY ANTIBIOTIC? °Continue to take your antibiotic for as long as told by your health care provider. Do not stop taking it when you feel better. If you stop taking it too soon: °· You may start to feel sick again. °· Your infection may become harder to treat. °· Complications may develop. °WHAT IF I MISS A DOSE? °Try not to miss any doses of medicine. If you miss a dose, take it as soon as possible. However, if it is almost time for the next dose: °· If you are taking 2 doses per day, take the missed dose and the next dose 5 to 6 hours apart. °· If you are taking 3 or more doses per day, take the missed dose and the next dose 2 to 4 hours apart, then go back to the normal schedule. °If you cannot make up a missed dose, take the next scheduled dose on time. Then take the missed dose after you have taken all the doses as recommended by your health care provider, as if you had one more dose left. °DO ANTIBIOTICS AFFECT BIRTH CONTROL? °Birth control pills may not work while you are on antibiotics. If you are taking birth  control pills, continue taking them as usual and use a second form of birth control, such as a condom, to avoid unwanted pregnancy. Continue using the second form of birth control until you are finished with your current 1 month cycle of birth control pills. °OTHER INFORMATION °· If there is any medicine left over, throw it away. °· Never take someone else's antibiotics. °· Never take leftover antibiotics. °SEEK MEDICAL CARE IF: °· You get worse. °· You do not feel better within a few days of starting the antibiotic medicine. °· You vomit. °· White patches appear in your mouth. °· You have new joint pain that begins after starting the antibiotic. °· You have new muscle aches that begin after starting the antibiotic. °· You had a fever before starting the antibiotic and it returns. °· You have any symptoms of an allergic reaction, such as an itchy rash. If this happens, stop taking the antibiotic. °SEEK IMMEDIATE MEDICAL CARE IF: °· Your urine turns dark or becomes blood-colored. °· Your skin turns yellow. °· You bruise or bleed easily. °· You have severe diarrhea and abdominal cramps. °· You have a severe headache. °· You have signs of a severe allergic reaction, such as: °¨ Trouble breathing. °¨ Wheezing. °¨ Swelling of the lips, tongue, or face. °¨ Fainting. °¨ Blisters on the skin or in the mouth. °If you have signs of a severe allergic   reaction, stop taking the antibiotic right away. °  °This information is not intended to replace advice given to you by your health care provider. Make sure you discuss any questions you have with your health care provider. °  °Document Released: 08/18/2004 Document Revised: 08/27/2015 Document Reviewed: 04/23/2015 °Elsevier Interactive Patient Education ©2016 [employer] ° °

## 2017-10-21 ENCOUNTER — Emergency Department (HOSPITAL_COMMUNITY)
Admission: EM | Admit: 2017-10-21 | Discharge: 2017-10-21 | Disposition: A | Discharge status: Home / Self Care | Payer: Self-pay | Attending: Emergency Medicine | Admitting: Emergency Medicine

## 2017-10-21 ENCOUNTER — Encounter (HOSPITAL_COMMUNITY): Payer: Self-pay | Admitting: Emergency Medicine

## 2017-10-21 DIAGNOSIS — Z5321 Procedure and treatment not carried out due to patient leaving prior to being seen by health care provider: Secondary | ICD-10-CM | POA: Insufficient documentation

## 2017-10-21 NOTE — ED Triage Notes (Signed)
C/o pain to R upper leg since at work last night. 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

## 2018-04-07 ENCOUNTER — Other Ambulatory Visit: Payer: Self-pay

## 2018-04-07 ENCOUNTER — Emergency Department (HOSPITAL_COMMUNITY)
Admission: EM | Admit: 2018-04-07 | Discharge: 2018-04-07 | Disposition: A | Discharge status: Home / Self Care | Payer: Self-pay | Attending: Emergency Medicine | Admitting: Emergency Medicine

## 2018-04-07 ENCOUNTER — Encounter (HOSPITAL_COMMUNITY): Payer: Self-pay

## 2018-04-07 DIAGNOSIS — J45909 Unspecified asthma, uncomplicated: Secondary | ICD-10-CM | POA: Insufficient documentation

## 2018-04-07 LAB — PREGNANCY, URINE: PREG TEST UR: NEGATIVE

## 2018-04-07 MED ORDER — BENZONATATE 100 MG PO CAPS: 100.0000 mg | ORAL_CAPSULE | Freq: Three times a day (TID) | ORAL | 0 refills | Status: DC | PRN

## 2018-04-07 MED ORDER — PREDNISONE 20 MG PO TABS: 40.0000 mg | ORAL_TABLET | Freq: Every day | ORAL | 0 refills | Status: DC

## 2018-04-07 MED ORDER — ALBUTEROL SULFATE HFA 108 (90 BASE) MCG/ACT IN AERS
2.0000 | INHALATION_SPRAY | Freq: Four times a day (QID) | RESPIRATORY_TRACT | Status: DC | PRN
  Administered 2018-04-07: 2 via RESPIRATORY_TRACT
  Filled 2018-04-07: qty 6.7

## 2018-04-07 NOTE — Discharge Instructions (Signed)
Take prednisone as prescribed until finished.  We recommend the use of Tessalon for cough.  Use 2 puffs of an albuterol inhaler every 4-6 hours for cough, wheezing, shortness of breath, discontinue smoking.  Follow-up with your primary care doctor to ensure resolution of symptoms.

## 2018-04-07 NOTE — ED Provider Notes (Signed)
[facility] DEPT Provider Note   CSN: [id number] Arrival date & time: 04/07/18  0026    History   Chief Complaint Chief Complaint  Patient presents with  . Cough    HPI Julie Choi is a 42 y.o. female.   42 year old female presents to the emergency department for evaluation of a cough times 1 week.  She reports a cough productive of yellow/green sputum, though this cough has been improving with less sputum production since taking Claritin.  She has had some chest discomfort which is aggravated with coughing.  She endorses a history of seasonal allergies, but wanted to make sure she did not have pneumonia.  No associated fevers, sick contacts, shortness of breath.  She has used an inhaler in the past, but has not had one to use recently.     Past Medical History:  Diagnosis Date  . Anxiety   . Medical history non-contributory     Patient Active Problem List   Diagnosis Date Noted  . NEVUS 08/09/2007  . OBESITY NOS 08/09/2007  . ANXIETY 08/09/2007  . DISORDER, TOBACCO USE 08/09/2007  . DEPRESSION 08/09/2007  . ALLERGIC RHINITIS 08/09/2007  . DERMATITIS, ATOPIC 08/09/2007    Past Surgical History:  Procedure Laterality Date  . WISDOM TOOTH EXTRACTION       OB History    Gravida  3   Para  1   Term      Preterm      AB      Living  1     SAB      TAB      Ectopic      Multiple      Live Births               Home Medications    Prior to Admission medications   Medication Sig Start Date End Date Taking? Authorizing Provider  albuterol (PROVENTIL HFA;VENTOLIN HFA) 108 (90 BASE) MCG/ACT inhaler Inhale 2 puffs into the lungs 2 (two) times daily as needed for wheezing or shortness of breath.     [provider]  benzonatate (TESSALON) 100 MG capsule Take 1 capsule (100 mg total) by mouth 3 (three) times daily as needed for cough. 04/07/18   Antonietta Breach, PA-C  cephALEXin (KEFLEX) 500 MG capsule Take 1  capsule (500 mg total) by mouth 2 (two) times daily. 03/05/15   Gregor Hams, MD  cephALEXin [facility]) 500 MG capsule 2 caps po bid x 7 days 05/21/15   Street, [place], PA-C  ciprofloxacin (CIPRO) 500 MG tablet Take 1 tablet (500 mg total) by mouth 2 (two) times daily. 06/17/16   Lysbeth Penner, FNP  fluticasone (FLONASE) 50 MCG/ACT nasal spray Place 2 sprays into both nostrils daily. 09/14/14   Linton Flemings, MD  hydrOXYzine (ATARAX/VISTARIL) 25 MG tablet Take 1 tablet (25 mg total) by mouth every 6 (six) hours. 04/27/16   Antonietta Breach, PA-C  loratadine (CLARITIN) 10 MG tablet Take 10 mg by mouth daily as needed for allergies.    [provider]  phenazopyridine (PYRIDIUM) 200 MG tablet Take 1 tablet (200 mg total) by mouth 3 (three) times daily. 06/17/16   Lysbeth Penner, FNP  predniSONE (DELTASONE) 20 MG tablet Take 2 tablets (40 mg total) by mouth daily. 04/07/18   Antonietta Breach, PA-C    Family History History reviewed. No pertinent family history.  Social History Social History   Tobacco Use  . Smoking status: Current Every Day  Smoker    Packs/day: 0.50    Years: 22.00    Pack years: 11.00    Types: Cigarettes  . Smokeless tobacco: Never Used  Substance Use Topics  . Alcohol use: No  . Drug use: No     Allergies   Other; Tomato; and Vicodin [hydrocodone-acetaminophen]   Review of Systems Review of Systems Ten systems reviewed and are negative for acute change, except as noted in the HPI.    Physical Exam Updated Vital Signs BP (!) 147/97 (BP Location: Left Arm)   Pulse 78   Temp 98 F (36.7 C) (Oral)   Resp 16   Ht 5\' 2"  (1.575 m)   Wt 108.9 kg (240 lb)   SpO2 97%   BMI 43.90 kg/m   Physical Exam  Constitutional: She is oriented to person, place, and time. She appears well-developed and well-nourished. No distress.  Nontoxic appearing and in NAD  HENT:  Head: Normocephalic and atraumatic.  Eyes: Conjunctivae and EOM are normal. No scleral icterus.    Neck: Normal range of motion.  Cardiovascular: Normal rate, regular rhythm and intact distal pulses.  Pulmonary/Chest: Effort normal. No stridor. No respiratory distress. She has wheezes. She has no rales.  Faint, diffuse wheeze with coughing or forceful expiration.  Adventitious sounds consistent with smoking history.  No rales or rhonchi.  Musculoskeletal: Normal range of motion.  Neurological: She is alert and oriented to person, place, and time. She exhibits normal muscle tone. Coordination normal.  Skin: Skin is warm and dry. No rash noted. She is not diaphoretic. No erythema. No pallor.  Psychiatric: She has a normal mood and affect. Her behavior is normal.  Nursing note and vitals reviewed.    ED Treatments / Results  Labs (all labs ordered are listed, but only abnormal results are displayed) Labs Reviewed  PREGNANCY, URINE    EKG None  Radiology No results found.  Procedures Procedures (including critical care time)  Medications Ordered in ED Medications  albuterol (PROVENTIL HFA;VENTOLIN HFA) 108 (90 Base) MCG/ACT inhaler 2 puff (2 puffs Inhalation Given 04/07/18 0420)     Initial Impression / Assessment and Plan / ED Course  I have reviewed the triage vital signs and the nursing notes.  Pertinent labs & imaging results that were available during my care of the patient were reviewed by me and considered in my medical decision making (see chart for details).     Patient presenting with cough x 1.5 weeks.  Wheezing noted with coughing or forceful expiration.  No fevers, hypoxia, respiratory distress.  She has had some improvement in symptoms with antihistamines; suspect allergic etiology.  Patient will be discharged with symptomatic treatment including inhaler, Prednisone burst, Tessalon.  The patient has been instructed to follow-up with her primary care doctor to ensure resolution of symptoms.  Return precautions discussed and provided. Patient discharged in stable  condition with no unaddressed concerns.   Final Clinical Impressions(s) / ED Diagnoses   Final diagnoses:  Bronchitis, allergic, unspecified asthma severity, uncomplicated    ED Discharge Orders        Ordered    predniSONE (DELTASONE) 20 MG tablet  Daily     04/07/18 0410    benzonatate (TESSALON) 100 MG capsule  3 times daily PRN     04/07/18 0410       Antonietta Breach, PA-C 04/07/18 0430    Ripley Fraise, MD 04/07/18 [id number]

## 2018-04-07 NOTE — ED Notes (Signed)
Patient has been sleeping

## 2018-04-07 NOTE — ED Triage Notes (Signed)
Pt reports a cough x1 week. She states that she was coughing up yellow/green sputum, but now it is clear. Reports, "I cough so much my chest hurts from it." Endorses seasonal allergies. Denies asthma, COPD, cardiac history, or SOB. A&Ox4. Ambulatory. Speaking in complete sentences. No coughing heard in triage.

## 2018-05-29 ENCOUNTER — Encounter (HOSPITAL_COMMUNITY): Payer: Self-pay | Admitting: Emergency Medicine

## 2018-05-29 ENCOUNTER — Ambulatory Visit (HOSPITAL_COMMUNITY)
Admission: EM | Admit: 2018-05-29 | Discharge: 2018-05-29 | Disposition: A | Discharge status: Home / Self Care | Payer: Medicaid Other | Attending: Family Medicine | Admitting: Family Medicine

## 2018-05-29 DIAGNOSIS — Z3202 Encounter for pregnancy test, result negative: Secondary | ICD-10-CM

## 2018-05-29 DIAGNOSIS — R635 Abnormal weight gain: Secondary | ICD-10-CM

## 2018-05-29 DIAGNOSIS — N925 Other specified irregular menstruation: Secondary | ICD-10-CM

## 2018-05-29 DIAGNOSIS — N912 Amenorrhea, unspecified: Secondary | ICD-10-CM

## 2018-05-29 DIAGNOSIS — R5383 Other fatigue: Secondary | ICD-10-CM

## 2018-05-29 DIAGNOSIS — R350 Frequency of micturition: Secondary | ICD-10-CM

## 2018-05-29 LAB — POCT URINALYSIS DIP (DEVICE)
BILIRUBIN URINE: NEGATIVE
GLUCOSE, UA: NEGATIVE mg/dL
KETONES UR: NEGATIVE mg/dL
Leukocytes, UA: NEGATIVE
Nitrite: NEGATIVE
PROTEIN: NEGATIVE mg/dL
Specific Gravity, Urine: 1.02 (ref 1.005–1.030)
Urobilinogen, UA: 0.2 mg/dL (ref 0.0–1.0)
pH: 7 (ref 5.0–8.0)

## 2018-05-29 LAB — POCT PREGNANCY, URINE: PREG TEST UR: NEGATIVE

## 2018-05-29 NOTE — Discharge Instructions (Signed)
Follow-up with your PCP.

## 2018-05-29 NOTE — ED Triage Notes (Signed)
Pt sts no period since April and wants to be checked

## 2018-05-30 NOTE — ED Provider Notes (Signed)
[facility]    CSN: [id number] Arrival date & time: 05/29/18  1558     History   Chief Complaint Chief Complaint  Patient presents with  . Possible Pregnancy    HPI Julie Choi is a 42 y.o. female.   HPI  ` Patient is here with a concern for pregnancy.  She states that she previously used Depo-Provera injections for birth control, but stopped this several months ago thinking she did not need them given her infrequent sexual activity and availability of alternate birth control.  She had irregular periods for a while, and then they seem to be regular.  Her last menstrual period was Easter.  She has had unprotected sexual relations since that time.  She did a negative pregnancy test at home.  She has concerned about pregnancy because of amenorrhea, fatigue, weight gain, and some urinary frequency.  No dysuria or sign of urinary infection.  She is not having hot flashes or sign of early menopause.  Past Medical History:  Diagnosis Date  . Anxiety   . Medical history non-contributory     Patient Active Problem List   Diagnosis Date Noted  . NEVUS 08/09/2007  . OBESITY NOS 08/09/2007  . ANXIETY 08/09/2007  . DISORDER, TOBACCO USE 08/09/2007  . DEPRESSION 08/09/2007  . ALLERGIC RHINITIS 08/09/2007  . DERMATITIS, ATOPIC 08/09/2007    Past Surgical History:  Procedure Laterality Date  . WISDOM TOOTH EXTRACTION      OB History    Gravida  3   Para  1   Term      Preterm      AB      Living  1     SAB      TAB      Ectopic      Multiple      Live Births               Home Medications    Prior to Admission medications   Medication Sig Start Date End Date Taking? Authorizing Provider  albuterol (PROVENTIL HFA;VENTOLIN HFA) 108 (90 BASE) MCG/ACT inhaler Inhale 2 puffs into the lungs 2 (two) times daily as needed for wheezing or shortness of breath.     [provider]  fluticasone (FLONASE) 50 MCG/ACT nasal spray Place 2  sprays into both nostrils daily. 09/14/14   Linton Flemings, MD  loratadine (CLARITIN) 10 MG tablet Take 10 mg by mouth daily as needed for allergies.    [provider]    Family History History reviewed. No pertinent family history. Mother and sister do have thyroid disease Social History Social History   Tobacco Use  . Smoking status: Current Every Day Smoker    Packs/day: 0.50    Years: 22.00    Pack years: 11.00    Types: Cigarettes  . Smokeless tobacco: Never Used  Substance Use Topics  . Alcohol use: No  . Drug use: No     Allergies   Other; Tomato; and Vicodin [hydrocodone-acetaminophen]   Review of Systems Review of Systems  Constitutional: Positive for fatigue and unexpected weight change. Negative for chills and fever.  HENT: Negative for ear pain and sore throat.   Eyes: Negative for pain and visual disturbance.  Respiratory: Negative for cough and shortness of breath.   Cardiovascular: Negative for chest pain and palpitations.  Gastrointestinal: Negative for abdominal pain and vomiting.  Genitourinary: Positive for frequency and menstrual problem. Negative for dysuria and hematuria.  Musculoskeletal: Negative  for arthralgias and back pain.  Skin: Negative for color change and rash.  Neurological: Negative for seizures and syncope.  All other systems reviewed and are negative.    Physical Exam Triage Vital Signs ED Triage Vitals [05/29/18 1625]  Enc Vitals Group     BP (!) 166/90     Pulse Rate 88     Resp 18     Temp 98.5 F (36.9 C)     Temp Source Oral     SpO2 100 %     Weight      Height      Head Circumference      Peak Flow      Pain Score      Pain Loc      Pain Edu?      Excl. in [place]?    No data found.  Updated Vital Signs BP (!) 166/90 (BP Location: Left Arm)   Pulse 88   Temp 98.5 F (36.9 C) (Oral)   Resp 18   LMP  (LMP Unknown)   SpO2 100%        Physical Exam  Constitutional: She appears well-developed and  well-nourished. No distress.  HENT:  Head: Normocephalic and atraumatic.  Right Ear: External ear normal.  Left Ear: External ear normal.  Mouth/Throat: Oropharynx is clear and moist.  Eyes: Pupils are equal, round, and reactive to light. Conjunctivae are normal.  Neck: Normal range of motion. No thyromegaly present.  Cardiovascular: Normal rate, regular rhythm and normal heart sounds.  Pulmonary/Chest: Effort normal and breath sounds normal. No respiratory distress. She has no wheezes.  Abdominal: Soft. Bowel sounds are normal. She exhibits no distension. There is no tenderness.  Musculoskeletal: Normal range of motion. She exhibits no edema.  Lymphadenopathy:    She has no cervical adenopathy.  Neurological: She is alert. She displays abnormal reflex.  Reflexes at knee are diminished  Skin: Skin is warm and dry.  Psychiatric: She has a normal mood and affect.     UC Treatments / Results  Labs (all labs ordered are listed, but only abnormal results are displayed) Labs Reviewed  POCT URINALYSIS DIP (DEVICE) - Abnormal; Notable for the following components:      Result Value   Hgb urine dipstick TRACE (*)    All other components within normal limits  POCT PREGNANCY, URINE    EKG None  Radiology No results found.  Procedures Procedures (including critical care time)  Medications Ordered in UC Medications - No data to display  Initial Impression / Assessment and Plan / UC Course  I have reviewed the triage vital signs and the nursing notes.  Pertinent labs & imaging results that were available during my care of the patient were reviewed by me and considered in my medical decision making (see chart for details).     Discussed with patient that her pregnancy test is negative.  Given the timing of her sexual relations it is unlikely that she is pregnant at this time.  Discussed that with alternate causes of fatigue such as hypothyroidism, anemia, stress, medical illness  could be at play.  I recommend she follow-up with her primary care doctor for thyroid and additional testing.  She states that thyroid disease does run in her family. Final Clinical Impressions(s) / UC Diagnoses   Final diagnoses:  Amenorrhea     Discharge Instructions     Follow up with you r PCP   ED Prescriptions    None  Controlled Substance Prescriptions [place] Controlled Substance Registry consulted? Not Applicable   Raylene Everts, MD 05/30/18 0930

## 2020-08-31 ENCOUNTER — Ambulatory Visit (HOSPITAL_COMMUNITY)
Admission: EM | Admit: 2020-08-31 | Discharge: 2020-08-31 | Disposition: A | Discharge status: Home / Self Care | Payer: Self-pay

## 2020-08-31 ENCOUNTER — Other Ambulatory Visit: Payer: Self-pay

## 2020-08-31 ENCOUNTER — Encounter (HOSPITAL_COMMUNITY): Payer: Self-pay | Admitting: Emergency Medicine

## 2020-08-31 DIAGNOSIS — Z113 Encounter for screening for infections with a predominantly sexual mode of transmission: Secondary | ICD-10-CM

## 2020-08-31 DIAGNOSIS — Z3202 Encounter for pregnancy test, result negative: Secondary | ICD-10-CM

## 2020-08-31 DIAGNOSIS — Z202 Contact with and (suspected) exposure to infections with a predominantly sexual mode of transmission: Secondary | ICD-10-CM

## 2020-08-31 DIAGNOSIS — N390 Urinary tract infection, site not specified: Secondary | ICD-10-CM

## 2020-08-31 LAB — HIV ANTIBODY (ROUTINE TESTING W REFLEX): HIV Screen 4th Generation wRfx: NONREACTIVE

## 2020-08-31 LAB — POCT URINALYSIS DIPSTICK, ED / UC
Bilirubin Urine: NEGATIVE
Glucose, UA: NEGATIVE mg/dL
Ketones, ur: NEGATIVE mg/dL
Nitrite: NEGATIVE
Protein, ur: NEGATIVE mg/dL
Specific Gravity, Urine: 1.01 (ref 1.005–1.030)
Urobilinogen, UA: 0.2 mg/dL (ref 0.0–1.0)
pH: 5.5 (ref 5.0–8.0)

## 2020-08-31 LAB — POC URINE PREG, ED: Preg Test, Ur: NEGATIVE

## 2020-08-31 MED ORDER — SULFAMETHOXAZOLE-TRIMETHOPRIM 800-160 MG PO TABS: 1.0000 | ORAL_TABLET | Freq: Two times a day (BID) | ORAL | 0 refills | Status: AC

## 2020-08-31 NOTE — ED Triage Notes (Signed)
Pt c/o lower back pain and burning with urination x one week. Pt states she also has a new partner and would like to be checked for STDs.

## 2020-08-31 NOTE — ED Provider Notes (Signed)
Julie Choi    CSN: [id number] Arrival date & time: 08/31/20  1034      History   Chief Complaint Chief Complaint  Patient presents with  . Urinary Tract Infection  . Exposure to STD    HPI Julie Choi is a 44 y.o. female.   Presenting today with about a week of dysuria, urinary frequency and right flank pain. Denies fever, chills, abdominal pain, N/V/D, vaginal discharge. Has a hx of UTIs that feel similar. Does have a new sexual partner and requests STI testing additionally.      Past Medical History:  Diagnosis Date  . Anxiety   . Medical history non-contributory     Patient Active Problem List   Diagnosis Date Noted  . NEVUS 08/09/2007  . OBESITY NOS 08/09/2007  . ANXIETY 08/09/2007  . DISORDER, TOBACCO USE 08/09/2007  . DEPRESSION 08/09/2007  . ALLERGIC RHINITIS 08/09/2007  . DERMATITIS, ATOPIC 08/09/2007    Past Surgical History:  Procedure Laterality Date  . WISDOM TOOTH EXTRACTION      OB History    Gravida  3   Para  1   Term      Preterm      AB      Living  1     SAB      TAB      Ectopic      Multiple      Live Births               Home Medications    Prior to Admission medications   Medication Sig Start Date End Date Taking? Authorizing Provider  Vitamin D, Ergocalciferol, (DRISDOL) 1.25 MG (50000 UNIT) CAPS capsule Take 50,000 Units by mouth every 7 (seven) days.   Yes [provider]  albuterol (PROVENTIL HFA;VENTOLIN HFA) 108 (90 BASE) MCG/ACT inhaler Inhale 2 puffs into the lungs 2 (two) times daily as needed for wheezing or shortness of breath.     [provider]  fluticasone (FLONASE) 50 MCG/ACT nasal spray Place 2 sprays into both nostrils daily. 09/14/14   Linton Flemings, MD  loratadine (CLARITIN) 10 MG tablet Take 10 mg by mouth daily as needed for allergies.    [provider]  sulfamethoxazole-trimethoprim (BACTRIM DS) 800-160 MG tablet Take 1 tablet by mouth 2 (two)  times daily for 3 days. 08/31/20 09/03/20  Volney American, PA-C    Family History Family History  Family history unknown: Yes    Social History Social History   Tobacco Use  . Smoking status: Current Every Day Smoker    Packs/day: 0.50    Years: 22.00    Pack years: 11.00    Types: Cigarettes  . Smokeless tobacco: Never Used  Substance Use Topics  . Alcohol use: No  . Drug use: No     Allergies   Other, Tomato, and Vicodin [hydrocodone-acetaminophen]   Review of Systems Review of Systems PER HPI   Physical Exam Triage Vital Signs ED Triage Vitals  Enc Vitals Group     BP --      Pulse Rate 08/31/20 1151 78     Resp 08/31/20 1151 16     Temp 08/31/20 1151 98.7 F (37.1 C)     Temp Source 08/31/20 1151 Oral     SpO2 08/31/20 1151 100 %     Weight --      Height --      Head Circumference --      Peak  Flow --      Pain Score 08/31/20 1142 7     Pain Loc --      Pain Edu? --      Excl. in [place]? --    No data found.  Updated Vital Signs Pulse 78   Temp 98.7 F (37.1 C) (Oral)   Resp 16   SpO2 100%   Visual Acuity Right Eye Distance:   Left Eye Distance:   Bilateral Distance:    Right Eye Near:   Left Eye Near:    Bilateral Near:     Physical Exam Vitals and nursing note reviewed.  Constitutional:      Appearance: Normal appearance. She is not ill-appearing.  HENT:     Head: Atraumatic.  Eyes:     Extraocular Movements: Extraocular movements intact.     Conjunctiva/sclera: Conjunctivae normal.  Cardiovascular:     Rate and Rhythm: Normal rate and regular rhythm.     Heart sounds: Normal heart sounds.  Pulmonary:     Effort: Pulmonary effort is normal.     Breath sounds: Normal breath sounds.  Abdominal:     General: Bowel sounds are normal. There is no distension.     Palpations: Abdomen is soft.     Tenderness: There is no abdominal tenderness. There is right CVA tenderness (minimal). There is no left CVA tenderness or guarding.    Musculoskeletal:        General: Normal range of motion.     Cervical back: Normal range of motion and neck supple.  Skin:    General: Skin is warm and dry.  Neurological:     Mental Status: She is alert and oriented to person, place, and time.  Psychiatric:        Mood and Affect: Mood normal.        Thought Content: Thought content normal.        Judgment: Judgment normal.      UC Treatments / Results  Labs (all labs ordered are listed, but only abnormal results are displayed) Labs Reviewed  POCT URINALYSIS DIPSTICK, ED / UC - Abnormal; Notable for the following components:      Result Value   Hgb urine dipstick LARGE (*)    Leukocytes,Ua LARGE (*)    All other components within normal limits  HIV ANTIBODY (ROUTINE TESTING W REFLEX)  RPR  POC URINE PREG, ED  CERVICOVAGINAL ANCILLARY ONLY    EKG   Radiology No results found.  Procedures Procedures (including critical care time)  Medications Ordered in UC Medications - No data to display  Initial Impression / Assessment and Plan / UC Course  I have reviewed the triage vital signs and the nursing notes.  Pertinent labs & imaging results that were available during my care of the patient were reviewed by me and considered in my medical decision making (see chart for details).     U/A positive for UTI, urine preg neg, STI panel and vaginal swab pending. WIll tx with bactrim for UTI, discussed increasing fluids and practicing good vaginal hygiene. Follow up if sxs worsening or not improving.   Final Clinical Impressions(s) / UC Diagnoses   Final diagnoses:  Lower urinary tract infectious disease  Routine screening for STI (sexually transmitted infection)   Discharge Instructions   None    ED Prescriptions    Medication Sig Dispense Auth. Provider   sulfamethoxazole-trimethoprim (BACTRIM DS) 800-160 MG tablet Take 1 tablet by mouth 2 (two) times daily for 3  days. 6 tablet Volney American, [place]      PDMP not reviewed this encounter.   Volney American, [place] 08/31/20 1258

## 2020-09-01 LAB — CERVICOVAGINAL ANCILLARY ONLY
Bacterial Vaginitis (gardnerella): POSITIVE — AB
Candida Glabrata: NEGATIVE
Candida Vaginitis: POSITIVE — AB
Chlamydia: NEGATIVE
Comment: NEGATIVE
Comment: NEGATIVE
Comment: NEGATIVE
Comment: NEGATIVE
Comment: NEGATIVE
Comment: NORMAL
Neisseria Gonorrhea: NEGATIVE
Trichomonas: NEGATIVE

## 2020-09-01 LAB — RPR: RPR Ser Ql: NONREACTIVE

## 2020-09-02 ENCOUNTER — Telehealth (HOSPITAL_COMMUNITY): Payer: Self-pay

## 2020-09-02 MED ORDER — FLUCONAZOLE 150 MG PO TABS: 150.0000 mg | ORAL_TABLET | Freq: Every day | ORAL | 0 refills | Status: AC

## 2020-09-02 MED ORDER — METRONIDAZOLE 500 MG PO TABS: 500.0000 mg | ORAL_TABLET | Freq: Two times a day (BID) | ORAL | 0 refills | Status: DC

## 2021-01-12 LAB — HM MAMMOGRAPHY

## 2021-02-16 ENCOUNTER — Encounter (HOSPITAL_COMMUNITY): Payer: Self-pay

## 2021-02-16 ENCOUNTER — Ambulatory Visit (HOSPITAL_COMMUNITY)
Admission: EM | Admit: 2021-02-16 | Discharge: 2021-02-16 | Disposition: A | Discharge status: Home / Self Care | Payer: Medicaid Other | Attending: Emergency Medicine | Admitting: Emergency Medicine

## 2021-02-16 ENCOUNTER — Other Ambulatory Visit: Payer: Self-pay

## 2021-02-16 DIAGNOSIS — Z3202 Encounter for pregnancy test, result negative: Secondary | ICD-10-CM

## 2021-02-16 DIAGNOSIS — N390 Urinary tract infection, site not specified: Secondary | ICD-10-CM | POA: Diagnosis present

## 2021-02-16 DIAGNOSIS — M545 Low back pain, unspecified: Secondary | ICD-10-CM | POA: Diagnosis present

## 2021-02-16 DIAGNOSIS — Z113 Encounter for screening for infections with a predominantly sexual mode of transmission: Secondary | ICD-10-CM

## 2021-02-16 DIAGNOSIS — N898 Other specified noninflammatory disorders of vagina: Secondary | ICD-10-CM

## 2021-02-16 DIAGNOSIS — Z0000 Encounter for general adult medical examination without abnormal findings: Secondary | ICD-10-CM

## 2021-02-16 LAB — POCT URINALYSIS DIPSTICK, ED / UC
Bilirubin Urine: NEGATIVE
Glucose, UA: NEGATIVE mg/dL
Ketones, ur: NEGATIVE mg/dL
Nitrite: NEGATIVE
Protein, ur: NEGATIVE mg/dL
Specific Gravity, Urine: 1.02 (ref 1.005–1.030)
Urobilinogen, UA: 0.2 mg/dL (ref 0.0–1.0)
pH: 5.5 (ref 5.0–8.0)

## 2021-02-16 LAB — POC URINE PREG, ED: Preg Test, Ur: NEGATIVE

## 2021-02-16 MED ORDER — PHENAZOPYRIDINE HCL 200 MG PO TABS: 200.0000 mg | ORAL_TABLET | Freq: Three times a day (TID) | ORAL | 0 refills | Status: DC

## 2021-02-16 MED ORDER — NITROFURANTOIN MACROCRYSTAL 100 MG PO CAPS: 100.0000 mg | ORAL_CAPSULE | Freq: Two times a day (BID) | ORAL | 0 refills | Status: DC

## 2021-02-16 NOTE — ED Triage Notes (Signed)
Pt presents with lower back pain and vaginal odor X 1 week.

## 2021-02-16 NOTE — ED Provider Notes (Signed)
[place]    CSN: [id number] Arrival date & time: 02/16/21  1729      History   Chief Complaint Chief Complaint  Patient presents with  . Back Pain  . Vaginal Odor    HPI Julie Choi is a 45 y.o. female.   45 year old Caucasian female presents to urgent care with complaint of low back pain, urgency, frequency, odor.  Patient wishes to be checked for STIs, no new partner feels like this is another urinary tract infection.  No treatments tried.  Patient states she was last here in September and checked for STDs, request testing again.  The history is provided by the patient. No language interpreter was used.    Past Medical History:  Diagnosis Date  . Anxiety   . Medical history non-contributory     Patient Active Problem List   Diagnosis Date Noted  . Acute left-sided low back pain without sciatica 02/16/2021  . Acute UTI 02/16/2021  . Routine screening for STI (sexually transmitted infection) 02/16/2021  . NEVUS 08/09/2007  . OBESITY NOS 08/09/2007  . ANXIETY 08/09/2007  . DISORDER, TOBACCO USE 08/09/2007  . DEPRESSION 08/09/2007  . ALLERGIC RHINITIS 08/09/2007  . DERMATITIS, ATOPIC 08/09/2007    Past Surgical History:  Procedure Laterality Date  . WISDOM TOOTH EXTRACTION      OB History    Gravida  3   Para  1   Term      Preterm      AB      Living  1     SAB      IAB      Ectopic      Multiple      Live Births               Home Medications    Prior to Admission medications   Medication Sig Start Date End Date Taking? Authorizing Provider  nitrofurantoin (MACRODANTIN) 100 MG capsule Take 1 capsule (100 mg total) by mouth 2 (two) times daily for 5 days. 2/54/27 0/6/23 Yes Julia Kulzer, Jeanett Schlein, NP  phenazopyridine (PYRIDIUM) 200 MG tablet Take 1 tablet (200 mg total) by mouth 3 (three) times daily. 7/62/83  Yes Jeyli Zwicker, Jeanett Schlein, NP  albuterol (PROVENTIL HFA;VENTOLIN HFA) 108 (90 BASE) MCG/ACT inhaler Inhale 2  puffs into the lungs 2 (two) times daily as needed for wheezing or shortness of breath.     [provider]  fluticasone (FLONASE) 50 MCG/ACT nasal spray Place 2 sprays into both nostrils daily. 09/14/14   Linton Flemings, MD  loratadine (CLARITIN) 10 MG tablet Take 10 mg by mouth daily as needed for allergies.    [provider]  metroNIDAZOLE (FLAGYL) 500 MG tablet Take 1 tablet (500 mg total) by mouth 2 (two) times daily. 09/02/20   Lamptey, Myrene Galas, MD  Vitamin D, Ergocalciferol, (DRISDOL) 1.25 MG (50000 UNIT) CAPS capsule Take 50,000 Units by mouth every 7 (seven) days.    [provider]    Family History Family History  Family history unknown: Yes    Social History Social History   Tobacco Use  . Smoking status: Current Every Day Smoker    Packs/day: 0.50    Years: 22.00    Pack years: 11.00    Types: Cigarettes  . Smokeless tobacco: Never Used  Substance Use Topics  . Alcohol use: No  . Drug use: No     Allergies   Other, Tomato, and Vicodin [hydrocodone-acetaminophen]   Review of Systems Review  of Systems  Constitutional: Negative for fever.  Gastrointestinal: Negative for nausea and vomiting.  Genitourinary: Positive for dysuria, frequency and urgency.  Musculoskeletal: Positive for back pain.  All other systems reviewed and are negative.    Physical Exam Triage Vital Signs ED Triage Vitals  Enc Vitals Group     BP      Pulse      Resp      Temp      Temp src      SpO2      Weight      Height      Head Circumference      Peak Flow      Pain Score      Pain Loc      Pain Edu?      Excl. in [place]?    No data found.  Updated Vital Signs BP 107/72 (BP Location: Right Arm)   Pulse 86   Temp 97.9 F (36.6 C) (Oral)   Resp 20   SpO2 100%   Visual Acuity Right Eye Distance:   Left Eye Distance:   Bilateral Distance:    Right Eye Near:   Left Eye Near:    Bilateral Near:     Physical Exam Vitals and nursing note  reviewed.  Constitutional:      General: She is active. She is not in acute distress.Vital signs are normal.     Appearance: Normal appearance. She is well-developed and well-nourished.  HENT:     Head: Normocephalic.  Eyes:     Pupils: Pupils are equal, round, and reactive to light.  Cardiovascular:     Rate and Rhythm: Normal rate and regular rhythm.  Pulmonary:     Effort: Pulmonary effort is normal.     Breath sounds: Normal breath sounds.  Abdominal:     General: Bowel sounds are normal.     Tenderness: There is no abdominal tenderness.     Comments: Obese  Musculoskeletal:        General: Normal range of motion.     Cervical back: Normal range of motion.  Skin:    General: Skin is warm and dry.     Capillary Refill: Capillary refill takes less than 2 seconds.  Neurological:     General: No focal deficit present.     Mental Status: She is alert and oriented to person, place, and time.     GCS: GCS eye subscore is 4. GCS verbal subscore is 5. GCS motor subscore is 6.  Psychiatric:        Mood and Affect: Mood and affect normal.        Speech: Speech normal.        Behavior: Behavior normal. Behavior is cooperative.      UC Treatments / Results  Labs (all labs ordered are listed, but only abnormal results are displayed) Labs Reviewed  POCT URINALYSIS DIPSTICK, ED / UC - Abnormal; Notable for the following components:      Result Value   Hgb urine dipstick MODERATE (*)    Leukocytes,Ua MODERATE (*)    All other components within normal limits  URINE CULTURE  POC URINE PREG, ED  CERVICOVAGINAL ANCILLARY ONLY    EKG   Radiology No results found.  Procedures Procedures (including critical care time)  Medications Ordered in UC Medications - No data to display  Initial Impression / Assessment and Plan / UC Course  I have reviewed the triage vital signs and the nursing  notes.  Pertinent labs & imaging results that were available during my care of the  patient were reviewed by me and considered in my medical decision making (see chart for details).      Final Clinical Impressions(s) / UC Diagnoses   Final diagnoses:  Acute left-sided low back pain without sciatica  Acute UTI  Routine screening for STI (sexually transmitted infection)     Discharge Instructions     Rest, push fluids, take meds as directed.  Your STD panel is pending.  Avoid sexual activity until results received ,safe sex practices with all future activity.  Go to ER if you develop nausea vomiting fever worsening symptoms    ED Prescriptions    Medication Sig Dispense Auth. Provider   nitrofurantoin (MACRODANTIN) 100 MG capsule Take 1 capsule (100 mg total) by mouth 2 (two) times daily for 5 days. 10 capsule Quindell Shere, Jeanett Schlein, NP   phenazopyridine (PYRIDIUM) 200 MG tablet Take 1 tablet (200 mg total) by mouth 3 (three) times daily. 6 tablet Oanh Devivo, Jeanett Schlein, NP     PDMP not reviewed this encounter.   Tori Milks, NP 97/67/34 2029

## 2021-02-16 NOTE — Discharge Instructions (Signed)
Rest, push fluids, take meds as directed.  Your STD panel is pending.  Avoid sexual activity until results received ,safe sex practices with all future activity.  Go to ER if you develop nausea vomiting fever worsening symptoms

## 2021-02-17 LAB — CERVICOVAGINAL ANCILLARY ONLY
Bacterial Vaginitis (gardnerella): POSITIVE — AB
Candida Glabrata: NEGATIVE
Candida Vaginitis: NEGATIVE
Chlamydia: NEGATIVE
Comment: NEGATIVE
Comment: NEGATIVE
Comment: NEGATIVE
Comment: NEGATIVE
Comment: NEGATIVE
Comment: NORMAL
Neisseria Gonorrhea: NEGATIVE
Trichomonas: NEGATIVE

## 2021-02-18 ENCOUNTER — Telehealth (HOSPITAL_COMMUNITY): Payer: Self-pay | Admitting: Emergency Medicine

## 2021-02-18 MED ORDER — METRONIDAZOLE 500 MG PO TABS: 500.0000 mg | ORAL_TABLET | Freq: Two times a day (BID) | ORAL | 0 refills | Status: DC

## 2021-02-19 ENCOUNTER — Telehealth (HOSPITAL_COMMUNITY): Payer: Self-pay | Admitting: Emergency Medicine

## 2021-02-19 LAB — URINE CULTURE: Culture: 70000 — AB

## 2021-02-19 MED ORDER — CEPHALEXIN 500 MG PO CAPS: 500.0000 mg | ORAL_CAPSULE | Freq: Two times a day (BID) | ORAL | 0 refills | Status: AC

## 2021-08-05 ENCOUNTER — Emergency Department (HOSPITAL_COMMUNITY)
Admission: EM | Admit: 2021-08-05 | Discharge: 2021-08-05 | Discharge status: Against Medical Advice | Payer: Medicaid Other | Attending: Emergency Medicine | Admitting: Emergency Medicine

## 2021-08-05 ENCOUNTER — Encounter (HOSPITAL_COMMUNITY): Payer: Self-pay

## 2021-08-05 DIAGNOSIS — N898 Other specified noninflammatory disorders of vagina: Secondary | ICD-10-CM | POA: Insufficient documentation

## 2021-08-05 DIAGNOSIS — R21 Rash and other nonspecific skin eruption: Secondary | ICD-10-CM | POA: Insufficient documentation

## 2021-08-05 DIAGNOSIS — F17210 Nicotine dependence, cigarettes, uncomplicated: Secondary | ICD-10-CM | POA: Insufficient documentation

## 2021-08-05 LAB — URINALYSIS, ROUTINE W REFLEX MICROSCOPIC
Bilirubin Urine: NEGATIVE
Glucose, UA: NEGATIVE mg/dL
Hgb urine dipstick: NEGATIVE
Ketones, ur: NEGATIVE mg/dL
Leukocytes,Ua: NEGATIVE
Nitrite: NEGATIVE
Protein, ur: NEGATIVE mg/dL
Specific Gravity, Urine: 1.013 (ref 1.005–1.030)
pH: 6 (ref 5.0–8.0)

## 2021-08-05 LAB — PREGNANCY, URINE: Preg Test, Ur: NEGATIVE

## 2021-08-05 MED ORDER — HYDROXYZINE HCL 25 MG PO TABS
25.0000 mg | ORAL_TABLET | Freq: Once | ORAL | Status: AC
  Administered 2021-08-05: 25 mg via ORAL
  Filled 2021-08-05: qty 1

## 2021-08-05 NOTE — ED Provider Notes (Signed)
[facility] DEPT Provider Note   CSN: [id number] Arrival date & time: 08/05/21  1414     History Chief Complaint  Patient presents with   Rash    Julie Choi is a 45 y.o. female with history of atopic dermatitis and anxiety.  Presents to the emergency department with a chief complaint of rash and vaginal odor.  Patient reports that she has rash to bilateral upper extremities, back and bilateral lower extremities.  Resolution present over the last 5 weeks.  Patient states that he has been living in a hotel the last 5 weeks while home repair was being performed.  Patient reports having 2 dogs at home.  Patient denies any new medications.  Patient reports that she has been using a new soap over the last few weeks.  Patient reports that rash is pruritic.  Patient denies any pain or purulent discharge associated with rash.  Patient states that her partner has an similar rash.  Patient also complains of vaginal odor.  Patient describes odor as "fishy."  Odor has been present over the last month.  Additionally patient reports abscess to groin and which has developed over the last few days.  Endorses mild tenderness to abscess.  Patient denies any purulent discharge from abscess.  Patient denies any dysuria, hematuria, urinary frequency, vaginal discharge, vaginal pruritus, vaginal pain, vaginal bleeding, genital sores or lesions, abdominal pain, nausea, vomiting.    Patient is sexually active in a mutually monogamous relationship with a female partner.  Patient is not on birth control and does not use condoms with sexual intercourse.  LMP 1 week prior.   Rash Associated symptoms: no abdominal pain, no fever, no headaches, no nausea and not vomiting       Past Medical History:  Diagnosis Date   Anxiety    Medical history non-contributory     Patient Active Problem List   Diagnosis Date Noted   Acute left-sided low back pain without sciatica 02/16/2021    Acute UTI 02/16/2021   Routine screening for STI (sexually transmitted infection) 02/16/2021   NEVUS 08/09/2007   OBESITY NOS 08/09/2007   ANXIETY 08/09/2007   DISORDER, TOBACCO USE 08/09/2007   DEPRESSION 08/09/2007   ALLERGIC RHINITIS 08/09/2007   DERMATITIS, ATOPIC 08/09/2007    Past Surgical History:  Procedure Laterality Date   WISDOM TOOTH EXTRACTION       OB History     Gravida  3   Para  1   Term      Preterm      AB      Living  1      SAB      IAB      Ectopic      Multiple      Live Births              Family History  Family history unknown: Yes    Social History   Tobacco Use   Smoking status: Every Day    Packs/day: 0.50    Years: 22.00    Pack years: 11.00    Types: Cigarettes   Smokeless tobacco: Never  Substance Use Topics   Alcohol use: No   Drug use: No    Home Medications Prior to Admission medications   Medication Sig Start Date End Date Taking? Authorizing Provider  albuterol (PROVENTIL HFA;VENTOLIN HFA) 108 (90 BASE) MCG/ACT inhaler Inhale 2 puffs into the lungs 2 (two) times daily as needed for wheezing or shortness  of breath.     [provider]  fluticasone (FLONASE) 50 MCG/ACT nasal spray Place 2 sprays into both nostrils daily. 09/14/14   Linton Flemings, MD  loratadine (CLARITIN) 10 MG tablet Take 10 mg by mouth daily as needed for allergies.    [provider]  metroNIDAZOLE (FLAGYL) 500 MG tablet Take 1 tablet (500 mg total) by mouth 2 (two) times daily. 02/18/21   LampteyMyrene Galas, MD  phenazopyridine (PYRIDIUM) 200 MG tablet Take 1 tablet (200 mg total) by mouth 3 (three) times daily. [id number]   Defelice, Jeanett Schlein, NP  Vitamin D, Ergocalciferol, (DRISDOL) 1.25 MG (50000 UNIT) CAPS capsule Take 50,000 Units by mouth every 7 (seven) days.    [provider]    Allergies    Other, Tomato, and Vicodin [hydrocodone-acetaminophen]  Review of Systems   Review of Systems  Constitutional:   Negative for chills and fever.  HENT:  Negative for mouth sores.   Eyes:  Negative for visual disturbance.  Gastrointestinal:  Negative for abdominal pain, nausea and vomiting.  Genitourinary:  Negative for difficulty urinating, dysuria, flank pain, frequency, genital sores, hematuria, pelvic pain, urgency, vaginal bleeding, vaginal discharge and vaginal pain.  Musculoskeletal:  Negative for back pain, neck pain and neck stiffness.  Skin:  Positive for rash. Negative for color change, pallor and wound.  Allergic/Immunologic: Negative for immunocompromised state.  Neurological:  Negative for dizziness, syncope, light-headedness and headaches.  Psychiatric/Behavioral:  Negative for confusion.    Physical Exam Updated Vital Signs BP (!) 161/128 (BP Location: Left Arm)   Pulse 98   Temp 99.1 F (37.3 C) (Oral)   Resp 18   SpO2 97%   Physical Exam Vitals and nursing note reviewed. Exam conducted with a chaperone present (Female RN present as chaperone).  Constitutional:      General: She is not in acute distress.    Appearance: She is obese. She is not ill-appearing, toxic-appearing or diaphoretic.  HENT:     Head: Normocephalic.  Eyes:     General: No scleral icterus.       Right eye: No discharge.        Left eye: No discharge.  Cardiovascular:     Rate and Rhythm: Normal rate.  Pulmonary:     Effort: Pulmonary effort is normal.  Abdominal:     Hernia: There is no hernia in the left inguinal area or right inguinal area.  Genitourinary:    Exam position: Lithotomy position.     Pubic Area: No rash or pubic lice.      Tanner stage (genital): 5.     Labia:        Right: No rash, tenderness, lesion or injury.        Left: No rash, tenderness, lesion or injury.      Urethra: No prolapse, urethral pain, urethral swelling or urethral lesion.     Vagina: No signs of injury and foreign body. Vaginal discharge present. No erythema, tenderness, bleeding, lesions or prolapsed vaginal  walls.     Cervix: Discharge present. No cervical motion tenderness, friability, lesion, erythema, cervical bleeding or eversion.     Uterus: Not enlarged and not tender.      Adnexa: Right adnexa normal and left adnexa normal.     Comments: Minimal white discharge noted from cervical os and in vaginal vault.  No cervical motion tenderness.  0.5 cm abscess to patient's groin with no surrounding erythema or induration. Lymphadenopathy:     Lower Body:  No right inguinal adenopathy. No left inguinal adenopathy.  Skin:    General: Skin is warm and dry.     Coloration: Skin is not cyanotic or jaundiced.     Findings: Rash present. No petechiae. Rash is papular. Rash is not purpuric, urticarial or vesicular.     Comments: Papular rash noted to patient's back, posterior neck, bilateral thighs, and bilateral upper arms.  No rash noted to patient's soles or oral mucosa.  Neurological:     General: No focal deficit present.     Mental Status: She is alert.  Psychiatric:        Behavior: Behavior is cooperative.    ED Results / Procedures / Treatments   Labs (all labs ordered are listed, but only abnormal results are displayed) Labs Reviewed  URINALYSIS, ROUTINE W REFLEX MICROSCOPIC - Abnormal; Notable for the following components:      Result Value   APPearance HAZY (*)    All other components within normal limits  WET PREP, GENITAL  PREGNANCY, URINE  RPR  HIV ANTIBODY (ROUTINE TESTING W REFLEX)  GC/CHLAMYDIA PROBE AMP ([place]) NOT AT [facility]    EKG None  Radiology No results found.  Procedures Procedures   Medications Ordered in ED Medications - No data to display  ED Course  I have reviewed the triage vital signs and the nursing notes.  Pertinent labs & imaging results that were available during my care of the patient were reviewed by me and considered in my medical decision making (see chart for details).    MDM Rules/Calculators/A&P                           Alert  45 year old female in no acute distress, nontoxic-appearing.  Presents with chief complaint of rash and vaginal odor.  Low suspicion for SJS as patient has no rash to oral mucosa or palms.  No petechia or purpura noted.  Suspect that patient's rashes environmental possibly due to fleas or other insects.  Patient was given for itching, plan to discharge with the same medication as well as hydrocortisone cream.  Due to patient's report of vaginal odor we will perform pelvic exam and obtain wet prep, gonorrhea chlamydia testing.  Shared decision making with patient about HIV and syphilis testing.  Patient is agreeable with this testing at this time.  Check decision-making with patient about empiric treatment for gonorrhea and chlamydia, patient defers treatment at this time.  Patient was given information to go to health department, urgent care, primary care provider, or return to the emergency department with positive test results for treatment.  Spoke to RN who reports that patient is now refusing syphilis and HIV testing.  Low suspicion for PID as patient has no cervical motion tenderness.   Urine pregnancy test negative Urinalysis shows no signs of infection  Wet prep testing was not ordered at the time of pelvic exam.  Upon realizing wet prep testing was not ordered, lab was contacted and provider was told that repeat sample will need to be obtained.  I went to speak to the patient about obtaining a self swab for wet prep testing however patient had left the emergency department [place] prior to completion of her assessment.  Final Clinical Impression(s) / ED Diagnoses Final diagnoses:  None    Rx / DC Orders ED Discharge Orders     None        Loni Beckwith, PA-C 08/05/21  [place], [place], DO 08/26/21 2342

## 2021-08-05 NOTE — ED Notes (Signed)
Attempted blood draw x 2 w/o success.  Pt refused another staff member attempting.  Collier Salina, [place] notified.

## 2021-08-05 NOTE — ED Notes (Signed)
Pt elected to leave prior to d/c.  Pt informed another staff member that she was leaving.  Pt encouraged to stay however chose not to.  Will d/c LWBS.

## 2021-08-05 NOTE — ED Triage Notes (Addendum)
Patient reports rash on bilateral anterior thighs and posterior thighs that is itchy. Patient reports boil/bump on labia. Patient reports vaginal fishy odor. Denies discharge or vaginal itchiness. Reports significant other also has same rash and thinks maybe it is from dogs.   A/Ox4 Ambulatory in triage.

## 2021-08-05 NOTE — ED Notes (Signed)
Patient asking how to get out of department. States she is leaving.

## 2021-08-06 LAB — GC/CHLAMYDIA PROBE AMP (~~LOC~~) NOT AT ARMC
Chlamydia: NEGATIVE
Comment: NEGATIVE
Comment: NORMAL
Neisseria Gonorrhea: NEGATIVE

## 2022-03-10 ENCOUNTER — Ambulatory Visit (HOSPITAL_COMMUNITY)
Admission: EM | Admit: 2022-03-10 | Discharge: 2022-03-10 | Disposition: A | Discharge status: Home / Self Care | Payer: Medicaid Other | Attending: Internal Medicine | Admitting: Internal Medicine

## 2022-03-10 ENCOUNTER — Encounter (HOSPITAL_COMMUNITY): Payer: Self-pay | Admitting: Emergency Medicine

## 2022-03-10 ENCOUNTER — Other Ambulatory Visit: Payer: Self-pay

## 2022-03-10 DIAGNOSIS — N949 Unspecified condition associated with female genital organs and menstrual cycle: Secondary | ICD-10-CM

## 2022-03-10 DIAGNOSIS — Z113 Encounter for screening for infections with a predominantly sexual mode of transmission: Secondary | ICD-10-CM

## 2022-03-10 NOTE — Discharge Instructions (Signed)
As we discussed, you can sit in a warm bath for about 15 minutes to see if this helps with the swelling or discomfort.  You could also try warm compresses on the area to see if this helps with it.  We have tested you for sexually transmitted diseases as well as bacterial vaginosis and yeast that could be causing her discomfort.  You will be contacted with the results tomorrow.  If you are not having pulmonary symptoms, especially if they worsen, I recommend follow-up with a gynecologist. ?

## 2022-03-10 NOTE — ED Provider Notes (Signed)
?[facility] ? ? ? ?CSN: [id number] ?Arrival date & time: 03/10/22  1062 ? ? ?  ? ?History   ?Chief Complaint ?Chief Complaint  ?Patient presents with  ? vaginal problem   ? ? ?HPI ?Julie Choi is a 46 y.o. female.  ? ?Vaginal complaints ?Patient reports over the last few days she has been noticing some swelling in her vaginal area when she wipes ?She denies any pain with this ?Denies any discharge ?Denies any fevers ?Otherwise feeling well ?States that she has been seen by gynecologist previously, but cannot go back to her gynecologist because she has an outstanding bill ?Has not been seen for this complaint ?She been urinating normally ? ? ?Past Medical History:  ?Diagnosis Date  ? Anxiety   ? Medical history non-contributory   ? ? ?Patient Active Problem List  ? Diagnosis Date Noted  ? Acute left-sided low back pain without sciatica 02/16/2021  ? Acute UTI 02/16/2021  ? Routine screening for STI (sexually transmitted infection) 02/16/2021  ? NEVUS 08/09/2007  ? OBESITY NOS 08/09/2007  ? ANXIETY 08/09/2007  ? DISORDER, TOBACCO USE 08/09/2007  ? DEPRESSION 08/09/2007  ? ALLERGIC RHINITIS 08/09/2007  ? DERMATITIS, ATOPIC 08/09/2007  ? ? ?Past Surgical History:  ?Procedure Laterality Date  ? WISDOM TOOTH EXTRACTION    ? ? ?OB History   ? ? Gravida  ?3  ? Para  ?1  ? Term  ?   ? Preterm  ?   ? AB  ?   ? Living  ?1  ?  ? ? SAB  ?   ? IAB  ?   ? Ectopic  ?   ? Multiple  ?   ? Live Births  ?   ?   ?  ?  ? ? ? ?Home Medications   ? ?Prior to Admission medications   ?Medication Sig Start Date End Date Taking? Authorizing Provider  ?albuterol (PROVENTIL HFA;VENTOLIN HFA) 108 (90 BASE) MCG/ACT inhaler Inhale 2 puffs into the lungs 2 (two) times daily as needed for wheezing or shortness of breath.     [provider]  ?fluticasone (FLONASE) 50 MCG/ACT nasal spray Place 2 sprays into both nostrils daily. 09/14/14   Linton Flemings, MD  ?loratadine (CLARITIN) 10 MG tablet Take 10 mg by mouth daily as needed  for allergies.    [provider]  ?metroNIDAZOLE (FLAGYL) 500 MG tablet Take 1 tablet (500 mg total) by mouth 2 (two) times daily. 02/18/21   Chase Picket, MD  ?phenazopyridine (PYRIDIUM) 200 MG tablet Take 1 tablet (200 mg total) by mouth 3 (three) times daily. 6/94/85   Defelice, Jeanett Schlein, NP  ?Vitamin D, Ergocalciferol, (DRISDOL) 1.25 MG (50000 UNIT) CAPS capsule Take 50,000 Units by mouth every 7 (seven) days.    [provider]  ? ? ?Family History ?Family History  ?Problem Relation Age of Onset  ? Hypertension Mother   ? Hypertension Sister   ? ? ?Social History ?Social History  ? ?Tobacco Use  ? Smoking status: Every Day  ?  Packs/day: 0.50  ?  Years: 22.00  ?  Pack years: 11.00  ?  Types: Cigarettes  ? Smokeless tobacco: Never  ?Substance Use Topics  ? Alcohol use: No  ? Drug use: No  ? ? ? ?Allergies   ?Other, Tomato, and Vicodin [hydrocodone-acetaminophen] ? ? ?Review of Systems ?Review of Systems  ?All other systems reviewed and are negative. ? ?Per HPI ?Physical Exam ?Triage Vital Signs ?ED  Triage Vitals [03/10/22 0934]  ?Enc Vitals Group  ?   BP (!) 165/123  ?   Pulse Rate 86  ?   Resp 16  ?   Temp 98.3 ?F (36.8 ?C)  ?   Temp Source Oral  ?   SpO2 95 %  ?   Weight 240 lb 1.3 oz (108.9 kg)  ?   Height '5\' 2"'$  (1.575 m)  ?   Head Circumference   ?   Peak Flow   ?   Pain Score 0  ?   Pain Loc   ?   Pain Edu?   ?   Excl. in [place]?   ? ?No data found. ? ?Updated Vital Signs ?BP (!) 165/123 (BP Location: Right Arm)   Pulse 86   Temp 98.3 ?F (36.8 ?C) (Oral)   Resp 16   Ht '5\' 2"'$  (1.575 m)   Wt 240 lb 1.3 oz (108.9 kg)   SpO2 95%   BMI 43.91 kg/m?  ? ?Visual Acuity ?Right Eye Distance:   ?Left Eye Distance:   ?Bilateral Distance:   ? ?Right Eye Near:   ?Left Eye Near:    ?Bilateral Near:    ? ?Physical Exam ?Exam conducted with a chaperone present.  ?Constitutional:   ?   General: She is not in acute distress. ?   Appearance: Normal appearance. She is not ill-appearing.  ?HENT:  ?    Head: Normocephalic and atraumatic.  ?Eyes:  ?   Conjunctiva/sclera: Conjunctivae normal.  ?Cardiovascular:  ?   Rate and Rhythm: Normal rate.  ?Pulmonary:  ?   Effort: Pulmonary effort is normal. No respiratory distress.  ?Genitourinary: ?   Comments: Speculum exam declined by patient ?No obvious swelling or abnormality of external vagina ?No TTP in region of Bartholin's glands ?Musculoskeletal:  ?   Cervical back: Normal range of motion.  ?Skin: ?   General: Skin is warm and dry.  ?Neurological:  ?   Mental Status: She is alert and oriented to person, place, and time.  ?Psychiatric:     ?   Mood and Affect: Mood normal.     ?   Behavior: Behavior normal.  ? ? ? ?UC Treatments / Results  ?Labs ?(all labs ordered are listed, but only abnormal results are displayed) ?Labs Reviewed  ?CERVICOVAGINAL ANCILLARY ONLY  ? ? ?EKG ? ? ?Radiology ?No results found. ? ?Procedures ?Procedures (including critical care time) ? ?Medications Ordered in UC ?Medications - No data to display ? ?Initial Impression / Assessment and Plan / UC Course  ?I have reviewed the triage vital signs and the nursing notes. ? ?Pertinent labs & imaging results that were available during my care of the patient were reviewed by me and considered in my medical decision making (see chart for details). ? ?  ? ?External exam unremarkable, patient declined speculum exam today.  We will perform STD testing as well as bacterial vaginosis and yeast testing to rule out infection.  Could consider that she had a Bartholin cyst previously that has not drained.  She has not had significant pain, but did recommend that she can try warm sits baths or warm compresses to see if this improves the swelling that she is noticing.  Recommend follow-up with gynecology if continues to have concerns and testing is unrevealing today. ? ? ?Final Clinical Impressions(s) / UC Diagnoses  ? ?Final diagnoses:  ?Vaginal discomfort  ?Screen for sexually transmitted diseases   ? ? ? ?Discharge Instructions   ? ?  ?  As we discussed, you can sit in a warm bath for about 15 minutes to see if this helps with the swelling or discomfort.  You could also try warm compresses on the area to see if this helps with it.  We have tested you for sexually transmitted diseases as well as bacterial vaginosis and yeast that could be causing her discomfort.  You will be contacted with the results tomorrow.  If you are not having pulmonary symptoms, especially if they worsen, I recommend follow-up with a gynecologist. ? ? ? ? ?ED Prescriptions   ?None ?  ? ?PDMP not reviewed this encounter. ?  ?Cleophas Dunker, DO ?03/10/22 1024 ? ?

## 2022-03-10 NOTE — ED Triage Notes (Signed)
Pt reports vaginal "swelling" x 1 week. Pt states vaginal insides have gotten bigger and feels like it "dropped"  ?

## 2022-03-11 LAB — CERVICOVAGINAL ANCILLARY ONLY
Bacterial Vaginitis (gardnerella): NEGATIVE
Candida Glabrata: NEGATIVE
Candida Vaginitis: NEGATIVE
Chlamydia: NEGATIVE
Comment: NEGATIVE
Comment: NEGATIVE
Comment: NEGATIVE
Comment: NEGATIVE
Comment: NEGATIVE
Comment: NORMAL
Neisseria Gonorrhea: NEGATIVE
Trichomonas: NEGATIVE

## 2022-06-21 ENCOUNTER — Ambulatory Visit: Payer: Medicaid Other | Admitting: Family Medicine

## 2022-10-29 ENCOUNTER — Encounter (HOSPITAL_COMMUNITY): Payer: Self-pay

## 2022-10-29 ENCOUNTER — Emergency Department (HOSPITAL_COMMUNITY): Payer: No Typology Code available for payment source

## 2022-10-29 ENCOUNTER — Emergency Department (HOSPITAL_COMMUNITY)
Admission: EM | Admit: 2022-10-29 | Discharge: 2022-10-29 | Disposition: A | Discharge status: Home / Self Care | Payer: No Typology Code available for payment source | Attending: Emergency Medicine | Admitting: Emergency Medicine

## 2022-10-29 ENCOUNTER — Other Ambulatory Visit: Payer: Self-pay

## 2022-10-29 DIAGNOSIS — Y92410 Unspecified street and highway as the place of occurrence of the external cause: Secondary | ICD-10-CM | POA: Insufficient documentation

## 2022-10-29 DIAGNOSIS — I10 Essential (primary) hypertension: Secondary | ICD-10-CM | POA: Insufficient documentation

## 2022-10-29 DIAGNOSIS — S39012A Strain of muscle, fascia and tendon of lower back, initial encounter: Secondary | ICD-10-CM | POA: Diagnosis not present

## 2022-10-29 DIAGNOSIS — J45909 Unspecified asthma, uncomplicated: Secondary | ICD-10-CM | POA: Insufficient documentation

## 2022-10-29 DIAGNOSIS — Z7951 Long term (current) use of inhaled steroids: Secondary | ICD-10-CM | POA: Diagnosis not present

## 2022-10-29 DIAGNOSIS — M79604 Pain in right leg: Secondary | ICD-10-CM | POA: Insufficient documentation

## 2022-10-29 DIAGNOSIS — S3992XA Unspecified injury of lower back, initial encounter: Secondary | ICD-10-CM | POA: Diagnosis present

## 2022-10-29 LAB — I-STAT BETA HCG BLOOD, ED (MC, WL, AP ONLY): I-stat hCG, quantitative: <5 m[IU]/mL (ref <5)

## 2022-10-29 MED ORDER — IBUPROFEN 600 MG PO TABS: 600.0000 mg | ORAL_TABLET | Freq: Four times a day (QID) | ORAL | 0 refills | Status: AC | PRN

## 2022-10-29 MED ORDER — METHOCARBAMOL 500 MG PO TABS: 500.0000 mg | ORAL_TABLET | Freq: Two times a day (BID) | ORAL | 0 refills | Status: AC

## 2022-10-29 MED ORDER — IBUPROFEN 400 MG PO TABS
600.0000 mg | ORAL_TABLET | Freq: Once | ORAL | Status: AC
  Administered 2022-10-29: 600 mg via ORAL
  Filled 2022-10-29: qty 1

## 2022-10-29 MED ORDER — METHOCARBAMOL 500 MG PO TABS
500.0000 mg | ORAL_TABLET | Freq: Once | ORAL | Status: AC
  Administered 2022-10-29: 500 mg via ORAL
  Filled 2022-10-29: qty 1
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
## 2022-11-16 ENCOUNTER — Ambulatory Visit (INDEPENDENT_AMBULATORY_CARE_PROVIDER_SITE_OTHER): Payer: Self-pay | Admitting: Nurse Practitioner

## 2022-11-16 ENCOUNTER — Encounter: Payer: Self-pay | Admitting: Nurse Practitioner

## 2022-11-16 VITALS — BP 131/69 | HR 87 | Temp 98.2°F | Ht 62.0 in | Wt 279.0 lb

## 2022-11-16 DIAGNOSIS — I10 Essential (primary) hypertension: Secondary | ICD-10-CM | POA: Insufficient documentation

## 2022-11-16 DIAGNOSIS — M546 Pain in thoracic spine: Secondary | ICD-10-CM

## 2022-11-16 DIAGNOSIS — M79605 Pain in left leg: Secondary | ICD-10-CM

## 2022-11-16 DIAGNOSIS — Z716 Tobacco abuse counseling: Secondary | ICD-10-CM

## 2022-11-16 DIAGNOSIS — J302 Other seasonal allergic rhinitis: Secondary | ICD-10-CM | POA: Insufficient documentation

## 2022-11-16 DIAGNOSIS — F411 Generalized anxiety disorder: Secondary | ICD-10-CM

## 2022-11-16 MED ORDER — ESCITALOPRAM OXALATE 10 MG PO TABS: 10.0000 mg | ORAL_TABLET | Freq: Every day | ORAL | 0 refills | Status: DC

## 2022-11-16 NOTE — Assessment & Plan Note (Signed)
BP Readings from Last 3 Encounters:  11/16/22 131/69  10/29/22 (!) 105/56  03/10/22 (!) 165/123  DASH diet advised patient encouraged to engage in regular moderate exercises at least 150 minutes weekly. She is currently not on medication

## 2022-11-16 NOTE — Assessment & Plan Note (Signed)
Start Lexapro 10 mg daily Follow-up in 6 weeks Patient denies SI, HI She Would like to wait on referral for counseling at this time till her next visit.
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
## 2022-11-16 NOTE — Assessment & Plan Note (Signed)
Smokes about 0.5 pack/day  Asked about quitting: confirms that he/she currently smokes cigarettes Advise to quit smoking: Educated about QUITTING to reduce the risk of cancer, cardio and cerebrovascular disease. Assess willingness: Unwilling to quit at this time, but is working on cutting back. Assist with counseling and pharmacotherapy: Counseled for 5 minutes and literature provided. Arrange for follow up: follow up in 6 weeks  and continue to offer help.

## 2022-11-16 NOTE — Patient Instructions (Addendum)
   5. GAD (generalized anxiety disorder)  - escitalopram (LEXAPRO) 10 MG tablet; Take 1 tablet (10 mg total) by mouth daily.  Dispense: 90 tablet; Refill: 0     It is important that you exercise regularly at least 30 minutes 5 times a week as tolerated  Think about what you will eat, plan ahead. Choose " clean, green, fresh or frozen" over canned, processed or packaged foods which are more sugary, salty and fatty. 70 to 75% of food eaten should be vegetables and fruit. Three meals at set times with snacks allowed between meals, but they must be fruit or vegetables. Aim to eat over a 12 hour period , example 7 am to 7 pm, and STOP after  your last meal of the day. Drink water,generally about 64 ounces per day, no other drink is as healthy. Fruit juice is best enjoyed in a healthy way, by EATING the fruit.  Thanks for choosing [facility] we consider it a privelige to serve you.

## 2022-11-16 NOTE — Assessment & Plan Note (Signed)
Continue ibuprofen and Robaxin as needed Application of heat is encouraged Patient encouraged to engage in regular moderate exercises at least 250 minutes weekly. Follow-up with chiropractor as planned

## 2022-11-16 NOTE — Assessment & Plan Note (Addendum)
Condition well-controlled Continue albuterol inhaler 2 puffs every 6 hours as needed Flonase nasal spray 2 spray into each nostril daily. Smoking cessation education completed.

## 2022-12-24 ENCOUNTER — Inpatient Hospital Stay: Payer: Medicaid Other | Admitting: Nurse Practitioner

## 2022-12-28 ENCOUNTER — Ambulatory Visit (INDEPENDENT_AMBULATORY_CARE_PROVIDER_SITE_OTHER): Payer: Medicaid Other | Admitting: Nurse Practitioner

## 2022-12-28 ENCOUNTER — Other Ambulatory Visit (HOSPITAL_COMMUNITY)
Admission: RE | Admit: 2022-12-28 | Discharge: 2022-12-28 | Disposition: A | Discharge status: Home / Self Care | Payer: Medicaid Other | Source: Ambulatory Visit | Attending: Nurse Practitioner | Admitting: Nurse Practitioner

## 2022-12-28 ENCOUNTER — Encounter: Payer: Self-pay | Admitting: Nurse Practitioner

## 2022-12-28 VITALS — BP 117/64 | HR 88 | Temp 98.1°F | Ht 62.0 in | Wt 276.0 lb

## 2022-12-28 DIAGNOSIS — Z13228 Encounter for screening for other metabolic disorders: Secondary | ICD-10-CM | POA: Diagnosis not present

## 2022-12-28 DIAGNOSIS — Z6841 Body Mass Index (BMI) 40.0 and over, adult: Secondary | ICD-10-CM

## 2022-12-28 DIAGNOSIS — Z1231 Encounter for screening mammogram for malignant neoplasm of breast: Secondary | ICD-10-CM | POA: Diagnosis not present

## 2022-12-28 DIAGNOSIS — R21 Rash and other nonspecific skin eruption: Secondary | ICD-10-CM | POA: Insufficient documentation

## 2022-12-28 DIAGNOSIS — Z23 Encounter for immunization: Secondary | ICD-10-CM | POA: Insufficient documentation

## 2022-12-28 DIAGNOSIS — Z1329 Encounter for screening for other suspected endocrine disorder: Secondary | ICD-10-CM | POA: Diagnosis not present

## 2022-12-28 DIAGNOSIS — L304 Erythema intertrigo: Secondary | ICD-10-CM | POA: Insufficient documentation

## 2022-12-28 DIAGNOSIS — Z1321 Encounter for screening for nutritional disorder: Secondary | ICD-10-CM | POA: Diagnosis not present

## 2022-12-28 DIAGNOSIS — Z720 Tobacco use: Secondary | ICD-10-CM | POA: Diagnosis not present

## 2022-12-28 DIAGNOSIS — Z0000 Encounter for general adult medical examination without abnormal findings: Secondary | ICD-10-CM

## 2022-12-28 DIAGNOSIS — Z124 Encounter for screening for malignant neoplasm of cervix: Secondary | ICD-10-CM | POA: Diagnosis not present

## 2022-12-28 DIAGNOSIS — E519 Thiamine deficiency, unspecified: Secondary | ICD-10-CM | POA: Diagnosis not present

## 2022-12-28 DIAGNOSIS — R944 Abnormal results of kidney function studies: Secondary | ICD-10-CM | POA: Diagnosis not present

## 2022-12-28 DIAGNOSIS — Z130 Encounter for screening for diseases of the blood and blood-forming organs and certain disorders involving the immune mechanism: Secondary | ICD-10-CM | POA: Diagnosis not present

## 2022-12-28 DIAGNOSIS — Z1211 Encounter for screening for malignant neoplasm of colon: Secondary | ICD-10-CM | POA: Diagnosis not present

## 2022-12-28 DIAGNOSIS — Z136 Encounter for screening for cardiovascular disorders: Secondary | ICD-10-CM | POA: Diagnosis not present

## 2022-12-28 DIAGNOSIS — Z114 Encounter for screening for human immunodeficiency virus [HIV]: Secondary | ICD-10-CM | POA: Diagnosis not present

## 2022-12-28 DIAGNOSIS — Z131 Encounter for screening for diabetes mellitus: Secondary | ICD-10-CM | POA: Diagnosis not present

## 2022-12-28 MED ORDER — ZINC OXIDE 40 % EX OINT: 1.0000 | TOPICAL_OINTMENT | CUTANEOUS | 0 refills | Status: DC | PRN

## 2022-12-28 MED ORDER — CLOTRIMAZOLE 1 % EX OINT: 1.0000 | TOPICAL_OINTMENT | Freq: Two times a day (BID) | CUTANEOUS | 0 refills | Status: DC

## 2022-12-28 NOTE — Assessment & Plan Note (Signed)
States that she is sometimes has yeast infection in  her abdominal folds, skin is warm and dry today, no evidence of intertrigo noted.  Clotrimazole 1% ointment 2 times daily for intertrigo ordered to be used as needed Need to keep skin clean and dry, weight loss encouraged

## 2022-12-28 NOTE — Progress Notes (Signed)
Complete physical exam  Patient: Julie Choi   DOB: 02-10-1976   47 y.o. Female  MRN: [id number]  Subjective:    Chief Complaint  Patient presents with   Annual Exam    Julie Choi is a 47 y.o. female with past medical history of atopic dermatitis obesity GAD tobacco use disorder presents for annual physical examination . She reports consuming a general diet.Does not exercise.  She continues to smoke 0.5 pack of cigarettes daily.  Patient denies shortness of breath, dizziness, chest pain, edema, syncope, fever, chills.  Cervical Pap exam completed today, flu vaccine administered in the office patient encouraged to get Tdap vaccine at the pharmacy.  Anxiety.  Patient was prescribed Lexapro 10 mg daily for anxiety at her last visit she stated that she was not able to afford cost of this medication.  She plans to check back with the pharmacy to see if her current insurance will cover the medication.  She denies SI, HI       Most recent fall risk assessment:     No data to display           Most recent depression screenings:    12/28/2022    8:39 AM 11/16/2022   10:30 AM  PHQ 2/9 Scores  PHQ - 2 Score 1 0        Patient Care Team: Pcp, No as PCP - General   Outpatient Medications Prior to Visit  Medication Sig   albuterol (PROVENTIL HFA;VENTOLIN HFA) 108 (90 BASE) MCG/ACT inhaler Inhale 2 puffs into the lungs 2 (two) times daily as needed for wheezing or shortness of breath.  (Patient not taking: Reported on 12/28/2022)   escitalopram (LEXAPRO) 10 MG tablet Take 1 tablet (10 mg total) by mouth daily. (Patient not taking: Reported on 12/28/2022)   fluticasone (FLONASE) 50 MCG/ACT nasal spray Place 2 sprays into both nostrils daily. (Patient not taking: Reported on 12/28/2022)   ibuprofen (ADVIL) 600 MG tablet Take 1 tablet (600 mg total) by mouth every 6 (six) hours as needed. (Patient not taking: Reported on 12/28/2022)   loratadine (CLARITIN) 10 MG tablet Take 10  mg by mouth daily as needed for allergies. (Patient not taking: Reported on 11/16/2022)   methocarbamol (ROBAXIN) 500 MG tablet Take 1 tablet (500 mg total) by mouth 2 (two) times daily. (Patient not taking: Reported on 11/16/2022)   Vitamin D, Ergocalciferol, (DRISDOL) 1.25 MG (50000 UNIT) CAPS capsule Take 50,000 Units by mouth every 7 (seven) days. (Patient not taking: Reported on 11/16/2022)   No facility-administered medications prior to visit.    Review of Systems  Constitutional:  Negative for chills, diaphoresis, fever, malaise/fatigue and weight loss.  HENT:  Negative for ear discharge, ear pain, hearing loss, nosebleeds and tinnitus.   Eyes: Negative.  Negative for pain, discharge and redness.  Respiratory: Negative.  Negative for cough, hemoptysis, sputum production and shortness of breath.   Cardiovascular:  Negative for chest pain, palpitations, orthopnea, claudication and leg swelling.  Gastrointestinal:  Negative for abdominal pain, constipation, diarrhea, heartburn, nausea and vomiting.  Genitourinary: Negative.  Negative for dysuria, frequency, hematuria and urgency.  Musculoskeletal: Negative.  Negative for back pain, joint pain, myalgias and neck pain.  Skin:  Positive for rash. Negative for itching.  Neurological: Negative.  Negative for dizziness, tingling, tremors, sensory change and headaches.  Endo/Heme/Allergies:  Negative for environmental allergies and polydipsia. Does not bruise/bleed easily.  Psychiatric/Behavioral:  Negative for depression, hallucinations, substance abuse and suicidal ideas.  The patient is nervous/anxious. The patient does not have insomnia.           Objective:     BP 117/64   Pulse 88   Temp 98.1 F (36.7 C)   Ht '5\' 2"'$  (1.575 m)   Wt 276 lb (125.2 kg)   SpO2 96%   BMI 50.48 kg/m    Physical Exam Vitals and nursing note reviewed. Exam conducted with a chaperone present.  Constitutional:      General: She is not in acute  distress.    Appearance: She is obese. She is not ill-appearing, toxic-appearing or diaphoretic.  HENT:     Right Ear: Tympanic membrane, ear canal and external ear normal. There is no impacted cerumen.     Left Ear: Tympanic membrane, ear canal and external ear normal. There is no impacted cerumen.     Nose: No congestion or rhinorrhea.     Mouth/Throat:     Mouth: Mucous membranes are moist.     Pharynx: Oropharynx is clear. No oropharyngeal exudate or posterior oropharyngeal erythema.  Eyes:     General: No scleral icterus.       Right eye: No discharge.        Left eye: No discharge.     Extraocular Movements: Extraocular movements intact.     Conjunctiva/sclera: Conjunctivae normal.  Neck:     Vascular: No carotid bruit.  Cardiovascular:     Rate and Rhythm: Normal rate and regular rhythm.     Pulses: Normal pulses.     Heart sounds: Normal heart sounds. No murmur heard.    No friction rub. No gallop.  Pulmonary:     Effort: Pulmonary effort is normal. No respiratory distress.     Breath sounds: No stridor. No wheezing, rhonchi or rales.  Chest:     Chest wall: No mass, lacerations, deformity, swelling, tenderness, crepitus or edema. There is no dullness to percussion.  Breasts:    Tanner Score is 5.     Right: Normal. No swelling, bleeding, inverted nipple, mass, nipple discharge or skin change.     Left: Normal. No swelling, bleeding, inverted nipple, mass, nipple discharge, skin change or tenderness.     Comments: Rashes noted on the both breast bilaterally.  No redness , swelling, drainage noted Abdominal:     General: There is no distension.     Palpations: Abdomen is soft. There is no mass.     Tenderness: There is no abdominal tenderness. There is no right CVA tenderness, left CVA tenderness, guarding or rebound.     Hernia: No hernia is present. There is no hernia in the left inguinal area or right inguinal area.  Genitourinary:    General: Normal vulva.     Exam  position: Lithotomy position.     Pubic Area: No rash or pubic lice.      Tanner stage (genital): 5.     Labia:        Right: No rash, tenderness, lesion or injury.        Left: No rash, tenderness, lesion or injury.      Urethra: No prolapse, urethral pain, urethral swelling or urethral lesion.     Vagina: No signs of injury and foreign body. No vaginal discharge, erythema, tenderness, bleeding, lesions or prolapsed vaginal walls.     Cervix: No cervical motion tenderness, discharge, friability, lesion, erythema, cervical bleeding or eversion.     Uterus: Not deviated, not enlarged, not fixed, not tender and  no uterine prolapse.      Adnexa:        Right: No mass, tenderness or fullness.         Left: No mass, tenderness or fullness.    Musculoskeletal:        General: No swelling, tenderness, deformity or signs of injury.     Cervical back: Normal range of motion. No rigidity or tenderness.     Right lower leg: No edema.     Left lower leg: No edema.  Lymphadenopathy:     Cervical: No cervical adenopathy.     Upper Body:     Right upper body: No supraclavicular, axillary or pectoral adenopathy.     Left upper body: No supraclavicular, axillary or pectoral adenopathy.     Lower Body: No right inguinal adenopathy. No left inguinal adenopathy.  Skin:    Capillary Refill: Capillary refill takes less than 2 seconds.     Coloration: Skin is not jaundiced or pale.     Findings: Rash present. No bruising, erythema or lesion.  Neurological:     Mental Status: She is alert and oriented to person, place, and time.     Cranial Nerves: No cranial nerve deficit.     Sensory: No sensory deficit.     Motor: No weakness.     Coordination: Coordination normal.     Gait: Gait normal.     Deep Tendon Reflexes: Reflexes normal.  Psychiatric:        Mood and Affect: Mood normal.        Behavior: Behavior normal.        Thought Content: Thought content normal.        Judgment: Judgment normal.       No results found for any visits on 12/28/22.     Assessment & Plan:    Routine Health Maintenance and Physical Exam  Immunization History  Administered Date(s) Administered   Influenza,inj,Quad PF,6+ Mos 12/28/2022    Health Maintenance  Topic Date Due   COVID-19 Vaccine (1) Never done   DTaP/Tdap/Td (1 - Tdap) Never done   PAP SMEAR-Modifier  08/09/2009   COLONOSCOPY (Pts 45-42yr Insurance coverage will need to be confirmed)  Never done   INFLUENZA VACCINE  Completed   Hepatitis C Screening  Completed   HIV Screening  Completed   HPV VACCINES  Aged Out    Discussed health benefits of physical activity, and encouraged her to engage in regular exercise appropriate for her age and condition.  Problem List Items Addressed This Visit       Musculoskeletal and Integument   Intertrigo    States that she is sometimes has yeast infection in  her abdominal folds, skin is warm and dry today, no evidence of intertrigo noted.  Clotrimazole 1% ointment 2 times daily for intertrigo ordered to be used as needed Need to keep skin clean and dry, weight loss encouraged      Rash and other nonspecific skin eruption    Papular pink rashes under both breast and groin area. No redness, drainage, swelling noted. 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 Will refer to dermatologist if needed        Other   OBESITY NOS    Wt Readings from Last 3 Encounters:  12/28/22 276 lb (125.2 kg)  11/16/22 279 lb (126.6 kg)  10/29/22 275 lb (124.7 kg)   Patient  counseled on low carb modified diet Encouraged to engage in regular moderate to vigorous exercise of at least 150 minutes weekly       Annual physical exam - Primary    Annual exam as documented.  Counseling done include healthy lifestyle involving committing to 150 minutes of exercise per week, heart healthy diet, and  attaining healthy weight. The importance of adequate sleep also discussed.  Regular use of seat belt and home safety were also discussed . Changes in health habits are decided on by patient with goals and time frames set for achieving them. Immunization and cancer screening  needs are specifically addressed at this visit.   Pap exam completed , flu vaccine given today , mammogram scheduled.  Referral for screening colonoscopy sent to GI      Tobacco abuse counseling   Need for immunization against influenza   Relevant Orders   Flu Vaccine QUAD 81moIM (Fluarix, Fluzone & Alfiuria Quad PF) (Completed)   Tobacco abuse    Smokes about 05 pack/day  Asked about quitting: confirms that he/she currently smokes cigarettes Advise to quit smoking: Educated about QUITTING to reduce the risk of cancer, cardio and cerebrovascular disease. Assess willingness: Unwilling to quit at this time, but is working on cutting back. Assist with counseling and pharmacotherapy: Counseled for 5 minutes and literature provided. Arrange for follow up: follow up in 3 months and continue to offer help.       Other Visit Diagnoses     Screening for colon cancer       Relevant Orders   Ambulatory referral to Gastroenterology   Encounter for screening mammogram for malignant neoplasm of breast       Relevant Orders   MM Digital Screening   Screening for endocrine, nutritional, metabolic and immunity disorder       Relevant Orders   Lipid Panel   CBC with Differential   CMP14+EGFR   Hemoglobin A1c   TSH   Vitamin D, 25-hydroxy   HIV antibody (with reflex)   Screening for cervical cancer       Relevant Orders   Cytology - PAP([facility])      Return in about 3 months (around 03/29/2023) for Anxiety.     FRenee Rival FNP

## 2022-12-28 NOTE — Assessment & Plan Note (Addendum)
Wt Readings from Last 3 Encounters:  12/28/22 276 lb (125.2 kg)  11/16/22 279 lb (126.6 kg)  10/29/22 275 lb (124.7 kg)   Patient counseled on low carb modified diet Encouraged to engage in regular moderate to vigorous exercise of at least 150 minutes weekly

## 2022-12-28 NOTE — Assessment & Plan Note (Addendum)
Annual exam as documented.  Counseling done include healthy lifestyle involving committing to 150 minutes of exercise per week, heart healthy diet, and attaining healthy weight. The importance of adequate sleep also discussed.  Regular use of seat belt and home safety were also discussed . Changes in health habits are decided on by patient with goals and time frames set for achieving them. Immunization and cancer screening  needs are specifically addressed at this visit.   Pap exam completed , flu vaccine given today , mammogram scheduled.  Referral for screening colonoscopy sent to GI

## 2022-12-28 NOTE — Patient Instructions (Addendum)
Please get TDAP vaccine at the pharmacy. Flu vaccine in the office today.    It is important that you exercise regularly at least 30 minutes 5 times a week as tolerated  Think about what you will eat, plan ahead. Choose " clean, green, fresh or frozen" over canned, processed or packaged foods which are more sugary, salty and fatty. 70 to 75% of food eaten should be vegetables and fruit. Three meals at set times with snacks allowed between meals, but they must be fruit or vegetables. Aim to eat over a 12 hour period , example 7 am to 7 pm, and STOP after  your last meal of the day. Drink water,generally about 64 ounces per day, no other drink is as healthy. Fruit juice is best enjoyed in a healthy way, by EATING the fruit.  Thanks for choosing [facility] we consider it a privelige to serve you.

## 2022-12-28 NOTE — Assessment & Plan Note (Signed)
Smokes about 0.5 pack/day  Asked about quitting: confirms that he/she currently smokes cigarettes Advise to quit smoking: Educated about QUITTING to reduce the risk of cancer, cardio and cerebrovascular disease. Assess willingness: Unwilling to quit at this time, but is working on cutting back. Assist with counseling and pharmacotherapy: Counseled for 5 minutes and literature provided. Arrange for follow up: follow up in 3 months and continue to offer help. 

## 2022-12-28 NOTE — Assessment & Plan Note (Signed)
Papular pink rashes under both breast and groin area. No redness, drainage, swelling noted. 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 Will refer to dermatologist if needed

## 2022-12-29 ENCOUNTER — Encounter: Payer: Self-pay | Admitting: Nurse Practitioner

## 2022-12-29 DIAGNOSIS — E559 Vitamin D deficiency, unspecified: Secondary | ICD-10-CM | POA: Insufficient documentation

## 2022-12-29 LAB — CMP14+EGFR
ALT: 17 IU/L (ref 0–32)
AST: 20 IU/L (ref 0–40)
Albumin/Globulin Ratio: 1.6 (ref 1.2–2.2)
Albumin: 4.2 g/dL (ref 3.9–4.9)
Alkaline Phosphatase: 95 IU/L (ref 44–121)
BUN/Creatinine Ratio: 15 (ref 9–23)
BUN: 11 mg/dL (ref 6–24)
Bilirubin Total: <0.2 mg/dL (ref 0.0–1.2)
CO2: 23 mmol/L (ref 20–29)
Calcium: 9.6 mg/dL (ref 8.7–10.2)
Chloride: 102 mmol/L (ref 96–106)
Creatinine, Ser: 0.72 mg/dL (ref 0.57–1.00)
Globulin, Total: 2.6 g/dL (ref 1.5–4.5)
Glucose: 89 mg/dL (ref 70–99)
Potassium: 4.3 mmol/L (ref 3.5–5.2)
Sodium: 140 mmol/L (ref 134–144)
Total Protein: 6.8 g/dL (ref 6.0–8.5)
eGFR: 103 mL/min/{1.73_m2} (ref >59)

## 2022-12-29 LAB — LIPID PANEL
Chol/HDL Ratio: 4.5 ratio — ABNORMAL HIGH (ref 0.0–4.4)
Cholesterol, Total: 180 mg/dL (ref 100–199)
HDL: 40 mg/dL (ref >39)
LDL Chol Calc (NIH): 123 mg/dL — ABNORMAL HIGH (ref 0–99)
Triglycerides: 94 mg/dL (ref 0–149)
VLDL Cholesterol Cal: 17 mg/dL (ref 5–40)

## 2022-12-29 LAB — CBC WITH DIFFERENTIAL/PLATELET
Basophils Absolute: 0.1 10*3/uL (ref 0.0–0.2)
Basos: 1 %
EOS (ABSOLUTE): 0.3 10*3/uL (ref 0.0–0.4)
Eos: 2 %
Hematocrit: 46.2 % (ref 34.0–46.6)
Hemoglobin: 15 g/dL (ref 11.1–15.9)
Immature Grans (Abs): 0.1 10*3/uL (ref 0.0–0.1)
Immature Granulocytes: 1 %
Lymphocytes Absolute: 1.9 10*3/uL (ref 0.7–3.1)
Lymphs: 16 %
MCH: 28.4 pg (ref 26.6–33.0)
MCHC: 32.5 g/dL (ref 31.5–35.7)
MCV: 88 fL (ref 79–97)
Monocytes Absolute: 0.6 10*3/uL (ref 0.1–0.9)
Monocytes: 5 %
Neutrophils Absolute: 9.4 10*3/uL — ABNORMAL HIGH (ref 1.4–7.0)
Neutrophils: 75 %
Platelets: 341 10*3/uL (ref 150–450)
RBC: 5.28 x10E6/uL (ref 3.77–5.28)
RDW: 12.3 % (ref 11.7–15.4)
WBC: 12.4 10*3/uL — ABNORMAL HIGH (ref 3.4–10.8)

## 2022-12-29 LAB — HEMOGLOBIN A1C
Est. average glucose Bld gHb Est-mCnc: 126 mg/dL
Hgb A1c MFr Bld: 6 % — ABNORMAL HIGH (ref 4.8–5.6)

## 2022-12-29 LAB — HIV ANTIBODY (ROUTINE TESTING W REFLEX): HIV Screen 4th Generation wRfx: NONREACTIVE

## 2022-12-29 LAB — VITAMIN D 25 HYDROXY (VIT D DEFICIENCY, FRACTURES): Vit D, 25-Hydroxy: 16.6 ng/mL — ABNORMAL LOW (ref 30.0–100.0)

## 2022-12-29 LAB — TSH: TSH: 2.14 u[IU]/mL (ref 0.450–4.500)

## 2022-12-29 NOTE — Progress Notes (Signed)
Eat a healthy diet, including lots of fruits and vegetables. Avoid foods with a lot of saturated and trans fats, such as red meat, butter, fried foods and cheese . Attain a healthy weight.  Prediabetes, avoid sugar sweets , soda.  Vitamin D deficiency . Take vitamin  OTC  D 1,000 units daily, foods rich in Vitamin D include Cod liver oil,Salmon,Swordfish,Tuna fish,,Dairy and plant milks fortified with vitamin D,Sardines,Beef liver . Get early morning sunshine  WBC cells are elevated patient should report fever, chills, malaise unintentional weight loss.   Other labs are normal   We will recheck vitamin D level at next visit.

## 2022-12-30 LAB — CYTOLOGY - PAP
Chlamydia: NEGATIVE
Comment: NEGATIVE
Comment: NEGATIVE
Comment: NORMAL
Diagnosis: NEGATIVE
Neisseria Gonorrhea: NEGATIVE
Trichomonas: NEGATIVE

## 2022-12-31 ENCOUNTER — Other Ambulatory Visit: Payer: Self-pay | Admitting: Nurse Practitioner

## 2022-12-31 DIAGNOSIS — B9689 Other specified bacterial agents as the cause of diseases classified elsewhere: Secondary | ICD-10-CM

## 2022-12-31 MED ORDER — METRONIDAZOLE 500 MG PO TABS: 500.0000 mg | ORAL_TABLET | Freq: Two times a day (BID) | ORAL | 0 refills | Status: AC

## 2022-12-31 NOTE — Progress Notes (Signed)
Normal Pap cytology except a Shift in flora suggestive of bacterial vaginosis.  Patient should take Flagyl 500 mg twice daily for 7 days.

## 2023-01-03 NOTE — Progress Notes (Signed)
Patient called asking about what the antibiotic is for. I explained it wasfor bacterial vaginosis that was found on the pap.

## 2023-01-04 ENCOUNTER — Telehealth: Payer: Self-pay

## 2023-01-04 NOTE — Telephone Encounter (Signed)
We received a referral on this patient for a screening colonoscopy, her BMI is 50.47. Do you want to see this patient for an OV or schedule with the nurse for a pre-visit?

## 2023-01-05 DIAGNOSIS — H5213 Myopia, bilateral: Secondary | ICD-10-CM | POA: Diagnosis not present

## 2023-01-05 NOTE — Telephone Encounter (Signed)
I have called and left patient a message to call back and schedule an OV.

## 2023-02-07 ENCOUNTER — Other Ambulatory Visit: Payer: Self-pay | Admitting: Nurse Practitioner

## 2023-02-08 ENCOUNTER — Other Ambulatory Visit: Payer: Self-pay | Admitting: Nurse Practitioner

## 2023-02-08 MED ORDER — CLOTRIMAZOLE 1 % EX OINT: 1.0000 | TOPICAL_OINTMENT | Freq: Two times a day (BID) | CUTANEOUS | 0 refills | Status: DC

## 2023-02-08 NOTE — Telephone Encounter (Signed)
Please advise Kh

## 2023-02-23 ENCOUNTER — Encounter: Payer: Medicaid Other | Admitting: Gastroenterology

## 2023-03-28 ENCOUNTER — Telehealth: Payer: Self-pay

## 2023-03-28 NOTE — Telephone Encounter (Signed)
Left a message. Gh 

## 2023-03-29 ENCOUNTER — Other Ambulatory Visit: Payer: Self-pay

## 2023-03-29 ENCOUNTER — Ambulatory Visit: Payer: Medicaid Other | Admitting: Nurse Practitioner

## 2024-02-14 ENCOUNTER — Ambulatory Visit: Payer: Self-pay | Admitting: Nurse Practitioner

## 2024-02-14 ENCOUNTER — Encounter: Payer: Self-pay | Admitting: Nurse Practitioner

## 2024-02-14 ENCOUNTER — Ambulatory Visit (INDEPENDENT_AMBULATORY_CARE_PROVIDER_SITE_OTHER): Payer: Medicaid Other | Admitting: Nurse Practitioner

## 2024-02-14 VITALS — BP 116/85 | HR 111 | Temp 97.0°F | Wt 282.0 lb

## 2024-02-14 DIAGNOSIS — J101 Influenza due to other identified influenza virus with other respiratory manifestations: Secondary | ICD-10-CM | POA: Diagnosis not present

## 2024-02-14 LAB — POCT INFLUENZA A/B
Influenza A, POC: POSITIVE — AB
Influenza B, POC: NEGATIVE

## 2024-02-14 LAB — POC COVID19 BINAXNOW: SARS Coronavirus 2 Ag: NEGATIVE

## 2024-02-14 MED ORDER — BENZONATATE 200 MG PO CAPS: 200.0000 mg | ORAL_CAPSULE | Freq: Three times a day (TID) | ORAL | 0 refills | Status: AC | PRN

## 2024-02-14 MED ORDER — OSELTAMIVIR PHOSPHATE 75 MG PO CAPS
75.0000 mg | ORAL_CAPSULE | Freq: Two times a day (BID) | ORAL | 0 refills | Status: AC
Start: 2024-02-14 — End: 2024-02-19

## 2024-02-14 MED ORDER — ALBUTEROL SULFATE HFA 108 (90 BASE) MCG/ACT IN AERS: 2.0000 | INHALATION_SPRAY | Freq: Four times a day (QID) | RESPIRATORY_TRACT | 1 refills | Status: AC | PRN

## 2024-02-14 NOTE — Telephone Encounter (Signed)
 Copied from CRM [id number] [id number]. Topic: Clinical - Red Word Triage >> Feb 14, 2024  8:38 AM Prudencio Pair wrote: Red Word that prompted transfer to Nurse Triage: Patient states she can't stop coughing & it's hurting her stomach. Wants to be seen today, if possible.   Chief Complaint: cough; abd pain Symptoms: sinus draining Frequency: constant Pertinent Negatives: Patient denies fever Disposition: [] ED /[] Urgent Care (no appt availability in office) / [x] Appointment(In office/virtual)/ []  [facility]/ [] Home Care/ [] Refused Recommended Disposition /[] [facility]/ []  Follow-up with PCP Additional Notes: Pt reports cough, states that its causing a headache and abd pain as well. Unrelieved with OTC medicine at home. Appt scheduled with pcp today at 0920   Reason for Disposition  [1] Continuous (nonstop) coughing interferes with work or school AND [2] no improvement using cough treatment per Care Advice  Answer Assessment - Initial Assessment Questions 1. ONSET: "When did the cough begin?"      Started 2 days ago  2. SEVERITY: "How bad is the cough today?"      Getting worse  3. SPUTUM: "Describe the color of your sputum" (none, dry cough; clear, white, yellow, green)     None  4. HEMOPTYSIS: "Are you coughing up any blood?" If so ask: "How much?" (flecks, streaks, tablespoons, etc.)     No  5. DIFFICULTY BREATHING: "Are you having difficulty breathing?" If Yes, ask: "How bad is it?" (e.g., mild, moderate, severe)    - MILD: No SOB at rest, mild SOB with walking, speaks normally in sentences, can lie down, no retractions, pulse < 100.    - MODERATE: SOB at rest, SOB with minimal exertion and prefers to sit, cannot lie down flat, speaks in phrases, mild retractions, audible wheezing, pulse 100-120.    - SEVERE: Very SOB at rest, speaks in single words, struggling to breathe, sitting hunched forward, retractions, pulse > 120      No shortness of breath  6. FEVER: "Do you have  a fever?" If Yes, ask: "What is your temperature, how was it measured, and when did it start?"     No  7. CARDIAC HISTORY: "Do you have any history of heart disease?" (e.g., heart attack, congestive heart failure)      No  8. LUNG HISTORY: "Do you have any history of lung disease?"  (e.g., pulmonary embolus, asthma, emphysema)     No  9. PE RISK FACTORS: "Do you have a history of blood clots?" (or: recent major surgery, recent prolonged travel, bedridden)     No  10. OTHER SYMPTOMS: "Do you have any other symptoms?" (e.g., runny nose, wheezing, chest pain)       Abdominal pain, sinus draining, headache  11. PREGNANCY: "Is there any chance you are pregnant?" "When was your last menstrual period?"       No  12. TRAVEL: "Have you traveled out of the country in the last month?" (e.g., travel history, exposures)       No  Protocols used: Cough - Acute Productive-A-AH

## 2024-02-14 NOTE — Progress Notes (Signed)
 Acute Office Visit  Subjective:     Patient ID: Julie Choi, female    DOB: 12/26/1975, 48 y.o.   MRN: [id number]  Chief Complaint  Patient presents with   Cough   Abdominal Pain    HPI Ms Genson has a past medical history of Allergy, Anxiety, and Medical history non-contributory. Patient is in today for  c/o cough, chills ,abdominal pain when she coughs, headache that started 2 days ago.  Stated that her daughter recently had the flu.  Patient is not vaccinated against the flu.  She denies fever, shortness of breath, chest pain, wheezing nausea, vomiting  Review of Systems  Constitutional:  Positive for chills. Negative for appetite change, fatigue and fever.  HENT:  Negative for congestion, postnasal drip, rhinorrhea and sneezing.   Respiratory:  Positive for cough. Negative for shortness of breath and wheezing.   Cardiovascular:  Negative for chest pain, palpitations and leg swelling.  Gastrointestinal:  Positive for abdominal pain. Negative for constipation, nausea and vomiting.  Genitourinary:  Negative for difficulty urinating, dysuria, flank pain and frequency.  Musculoskeletal:  Negative for arthralgias, back pain, joint swelling and myalgias.  Skin:  Negative for color change, pallor, rash and wound.  Neurological:  Positive for headaches. Negative for dizziness, facial asymmetry, weakness and numbness.  Psychiatric/Behavioral:  Negative for behavioral problems, confusion, self-injury and suicidal ideas.         Objective:    BP 116/85   Pulse (!) 111   Temp (!) 97 F (36.1 C)   Wt 282 lb (127.9 kg)   SpO2 96%   BMI 51.58 kg/m    Physical Exam Vitals and nursing note reviewed.  Constitutional:      General: She is not in acute distress.    Appearance: Normal appearance. She is obese. She is not ill-appearing, toxic-appearing or diaphoretic.  HENT:     Nose: No congestion or rhinorrhea.     Mouth/Throat:     Mouth: Mucous membranes are moist.      Pharynx: Oropharynx is clear. No oropharyngeal exudate or posterior oropharyngeal erythema.  Eyes:     General: No scleral icterus.       Right eye: No discharge.        Left eye: No discharge.     Extraocular Movements: Extraocular movements intact.     Conjunctiva/sclera: Conjunctivae normal.  Cardiovascular:     Rate and Rhythm: Normal rate and regular rhythm.     Pulses: Normal pulses.     Heart sounds: Normal heart sounds. No murmur heard.    No friction rub. No gallop.  Pulmonary:     Effort: Pulmonary effort is normal. No respiratory distress.     Breath sounds: Normal breath sounds. No stridor. No wheezing, rhonchi or rales.  Chest:     Chest wall: No tenderness.  Abdominal:     General: There is no distension.     Palpations: Abdomen is soft.     Tenderness: There is abdominal tenderness. There is no right CVA tenderness, left CVA tenderness or guarding.     Comments: Right upper quadrant   Musculoskeletal:        General: No swelling, tenderness, deformity or signs of injury.     Right lower leg: No edema.     Left lower leg: No edema.  Skin:    General: Skin is warm and dry.     Capillary Refill: Capillary refill takes less than 2 seconds.  Coloration: Skin is not jaundiced or pale.     Findings: No bruising, erythema or lesion.  Neurological:     Mental Status: She is alert and oriented to person, place, and time.     Motor: No weakness.     Coordination: Coordination normal.     Gait: Gait normal.  Psychiatric:        Mood and Affect: Mood normal.        Behavior: Behavior normal.        Thought Content: Thought content normal.        Judgment: Judgment normal.     No results found for any visits on 02/14/24.      Assessment & Plan:   Problem List Items Addressed This Visit   None   No orders of the defined types were placed in this encounter.   No follow-ups on file.  Donell Beers, FNP

## 2024-02-14 NOTE — Assessment & Plan Note (Signed)
 1. Influenza A (Primary) Test positive for influenza A  - benzonatate (TESSALON) 200 MG capsule; Take 1 capsule (200 mg total) by mouth 3 (three) times daily as needed for cough.  Dispense: 20 capsule; Refill: 0 - oseltamivir (TAMIFLU) 75 MG capsule; Take 1 capsule (75 mg total) by mouth 2 (two) times daily for 5 days.  Dispense: 10 capsule; Refill: 0 - albuterol (VENTOLIN HFA) 108 (90 Base) MCG/ACT inhaler; Inhale 2 puffs into the lungs every 6 (six) hours as needed for wheezing or shortness of breath.  Dispense: 8 g; Refill: 1  Take Tylenol 650 mg every 6 hours as needed for headaches, fever, body aches Patient encouraged to drink at least 64 ounces of water daily to maintain hydration.  Encouraged to get her flu vaccine when she has fully recovered from the flu .  Importance of proper hand hygiene and wearing mask to minimize spreading infection to ordered discussed

## 2024-02-14 NOTE — Patient Instructions (Addendum)
 1. Influenza A (Primary)  - POC COVID-19 BinaxNow - POCT Influenza A/B - benzonatate (TESSALON) 200 MG capsule; Take 1 capsule (200 mg total) by mouth 3 (three) times daily as needed for cough.  Dispense: 20 capsule; Refill: 0 - oseltamivir (TAMIFLU) 75 MG capsule; Take 1 capsule (75 mg total) by mouth 2 (two) times daily for 5 days.  Dispense: 10 capsule; Refill: 0 - albuterol (VENTOLIN HFA) 108 (90 Base) MCG/ACT inhaler; Inhale 2 puffs into the lungs every 6 (six) hours as needed for wheezing or shortness of breath.  Dispense: 8 g; Refill: 1   Please take Tylenol 650 mg every 6 hours as needed for fever, body aches Drink at least 64 ounces of water daily to maintain hydration   Preventing flu spread Importance of staying home while contagious Proper hygiene practices (hand-washing, covering coughs, disinfecting surfaces) Use of masks in public or around vulnerable populations . Wipe down surfaces in your home with antiviral wipes, wash your hands often and refrain from close contact with others in your home   When can I resume normal activity after the flu? Most flu symptoms go away in four to seven days for otherwise healthy adults, but you still may be contagious. You may go back to light exercise when you feel like it, but listen to your body and rest when you need to. The Centers for Disease Control and Prevention advises staying home for at least 24 hours after your fever goes away without the help of ibuprofen (Motril or Advil) or acetaminophen (Tylenol).    Thanks for choosing [facility] we consider it a privelige to serve you.

## 2024-04-13 ENCOUNTER — Ambulatory Visit: Payer: Self-pay | Admitting: Nurse Practitioner

## 2024-05-01 ENCOUNTER — Encounter (INDEPENDENT_AMBULATORY_CARE_PROVIDER_SITE_OTHER): Payer: Self-pay

## 2024-05-01 ENCOUNTER — Ambulatory Visit (INDEPENDENT_AMBULATORY_CARE_PROVIDER_SITE_OTHER): Admitting: Nurse Practitioner

## 2024-05-01 ENCOUNTER — Encounter: Payer: Self-pay | Admitting: Nurse Practitioner

## 2024-05-01 VITALS — BP 134/94 | HR 92 | Temp 97.1°F | Ht 62.0 in | Wt 278.0 lb

## 2024-05-01 DIAGNOSIS — Z1231 Encounter for screening mammogram for malignant neoplasm of breast: Secondary | ICD-10-CM | POA: Diagnosis not present

## 2024-05-01 DIAGNOSIS — Z1329 Encounter for screening for other suspected endocrine disorder: Secondary | ICD-10-CM

## 2024-05-01 DIAGNOSIS — I10 Essential (primary) hypertension: Secondary | ICD-10-CM

## 2024-05-01 DIAGNOSIS — Z13228 Encounter for screening for other metabolic disorders: Secondary | ICD-10-CM | POA: Diagnosis not present

## 2024-05-01 DIAGNOSIS — Z0000 Encounter for general adult medical examination without abnormal findings: Secondary | ICD-10-CM

## 2024-05-01 DIAGNOSIS — Z113 Encounter for screening for infections with a predominantly sexual mode of transmission: Secondary | ICD-10-CM | POA: Diagnosis not present

## 2024-05-01 DIAGNOSIS — Z720 Tobacco use: Secondary | ICD-10-CM

## 2024-05-01 DIAGNOSIS — Z1321 Encounter for screening for nutritional disorder: Secondary | ICD-10-CM

## 2024-05-01 DIAGNOSIS — L304 Erythema intertrigo: Secondary | ICD-10-CM

## 2024-05-01 DIAGNOSIS — R21 Rash and other nonspecific skin eruption: Secondary | ICD-10-CM

## 2024-05-01 DIAGNOSIS — Z1211 Encounter for screening for malignant neoplasm of colon: Secondary | ICD-10-CM

## 2024-05-01 DIAGNOSIS — Z13220 Encounter for screening for lipoid disorders: Secondary | ICD-10-CM | POA: Diagnosis not present

## 2024-05-01 DIAGNOSIS — F1290 Cannabis use, unspecified, uncomplicated: Secondary | ICD-10-CM

## 2024-05-01 DIAGNOSIS — F419 Anxiety disorder, unspecified: Secondary | ICD-10-CM

## 2024-05-01 DIAGNOSIS — Z6841 Body Mass Index (BMI) 40.0 and over, adult: Secondary | ICD-10-CM | POA: Diagnosis not present

## 2024-05-01 DIAGNOSIS — F32A Depression, unspecified: Secondary | ICD-10-CM

## 2024-05-01 DIAGNOSIS — R7303 Prediabetes: Secondary | ICD-10-CM

## 2024-05-01 DIAGNOSIS — Z130 Encounter for screening for diseases of the blood and blood-forming organs and certain disorders involving the immune mechanism: Secondary | ICD-10-CM

## 2024-05-01 LAB — POCT GLYCOSYLATED HEMOGLOBIN (HGB A1C): Hemoglobin A1C: 6.3 % — AB (ref 4.0–5.6)

## 2024-05-01 MED ORDER — VALSARTAN 80 MG PO TABS: 80.0000 mg | ORAL_TABLET | Freq: Every day | ORAL | 0 refills | Status: AC

## 2024-05-01 MED ORDER — CLOTRIMAZOLE 1 % EX OINT: 1.0000 | TOPICAL_OINTMENT | Freq: Two times a day (BID) | CUTANEOUS | 0 refills | Status: AC

## 2024-05-01 NOTE — Assessment & Plan Note (Signed)
 Smokes about 15 cigarettes daily /day  Asked about quitting: confirms that he/she currently smokes cigarettes Advise to quit smoking: Educated about QUITTING to reduce the risk of cancer, cardio and cerebrovascular disease. Assess willingness: Unwilling to quit at this time, but is working on cutting back. Assist with counseling and pharmacotherapy: Counseled for 5 minutes and literature provided. Arrange for follow up: follow up in 1 month and continue to offer help.

## 2024-05-01 NOTE — Assessment & Plan Note (Signed)
 Annual exam as documented.  Counseling done include healthy lifestyle involving committing to 150 minutes of exercise per week, heart healthy diet, and attaining healthy weight. The importance of adequate sleep also discussed.  Regular use of seat belt and home safety were also discussed . Changes in health habits are decided on by patient with goals and time frames set for achieving them. Immunization and cancer screening  needs are specifically addressed at this visit.    Routine fasting labs ordered, mammogram ordered, Cologuard ordered Encouraged to get pneumococcal vaccine and Tdap vaccine at the pharmacy

## 2024-05-01 NOTE — Assessment & Plan Note (Signed)
 Lab Results  Component Value Date   HGBA1C 6.3 (A) 05/01/2024    Patient counseled on low-carb diet Encouraged to lose weight

## 2024-05-01 NOTE — Assessment & Plan Note (Signed)
 Cessation encouraged

## 2024-05-01 NOTE — Progress Notes (Signed)
 Complete physical exam  Patient: Julie Choi   DOB: [birth year]   48 y.o. Female  MRN: [id number]  Subjective:     Chief Complaint  Patient presents with   Annual Exam    Julie Choi is a 48 y.o. female  has a past medical history of Allergy, Anxiety, and Medical history non-contributory. who presents today for a complete physical exam. She reports consuming a general diet. The patient does not participate in regular exercise at present. She generally feels well. She reports sleeping well.   Tobacco use disorder.  Smokes 10 to 15 cigarettes daily.  Current denies , shortness of breath cough wheezing  Hypertension.  Stated that her diastolic blood pressure has been consistently elevated recently, has been having blood pressure checked before donating plasma, they had told her to follow-up with her PCP to have blood pressure addressed.  Currently denies chest pain, shortness of breath, edema  Patient complains of vaginal discharge, no genital rashes, dysuria, genital itching.  Would like STD testing done  Mammogram ordered for breast cancer screening.  Encouraged to get Tdap vaccine, pneumococcal vaccine.  Cologuard ordered  Most recent fall risk assessment:     No data to display           Most recent depression screenings:    05/01/2024    8:05 AM 02/14/2024    9:16 AM  PHQ 2/9 Scores  PHQ - 2 Score 0 0        Patient Care Team: Hailey Miles R, FNP as PCP - General (Nurse Practitioner)   Outpatient Medications Prior to Visit  Medication Sig   albuterol  (VENTOLIN  HFA) 108 (90 Base) MCG/ACT inhaler Inhale 2 puffs into the lungs every 6 (six) hours as needed for wheezing or shortness of breath.   benzonatate  (TESSALON ) 200 MG capsule Take 1 capsule (200 mg total) by mouth 3 (three) times daily as needed for cough.   fluticasone  (FLONASE ) 50 MCG/ACT nasal spray Place 2 sprays into both nostrils daily. (Patient not taking: Reported on 12/28/2022)    ibuprofen  (ADVIL ) 600 MG tablet Take 1 tablet (600 mg total) by mouth every 6 (six) hours as needed. (Patient not taking: Reported on 12/28/2022)   loratadine (CLARITIN) 10 MG tablet Take 10 mg by mouth daily as needed for allergies. (Patient not taking: Reported on 11/16/2022)   methocarbamol  (ROBAXIN ) 500 MG tablet Take 1 tablet (500 mg total) by mouth 2 (two) times daily. (Patient not taking: Reported on 11/16/2022)   Vitamin D , Ergocalciferol , (DRISDOL) 1.25 MG (50000 UNIT) CAPS capsule Take 50,000 Units by mouth every 7 (seven) days. (Patient not taking: Reported on 11/16/2022)   [DISCONTINUED] Clotrimazole  1 % OINT Apply 1 Application topically 2 (two) times daily. (Patient not taking: Reported on 05/01/2024)   [DISCONTINUED] escitalopram  (LEXAPRO ) 10 MG tablet Take 1 tablet (10 mg total) by mouth daily. (Patient not taking: Reported on 12/28/2022)   [DISCONTINUED] Pseudoeph-Doxylamine-DM-APAP (NYQUIL PO) Take by mouth. (Patient not taking: Reported on 05/01/2024)   No facility-administered medications prior to visit.    Review of Systems  Constitutional:  Negative for appetite change, chills, fatigue and fever.  HENT:  Negative for congestion, postnasal drip, rhinorrhea and sneezing.   Eyes:  Negative for pain, discharge and itching.  Respiratory:  Negative for cough, shortness of breath and wheezing.   Cardiovascular:  Negative for chest pain, palpitations and leg swelling.  Gastrointestinal:  Negative for abdominal pain, constipation, nausea and vomiting.  Endocrine: Negative for cold intolerance,  heat intolerance and polydipsia.  Genitourinary:  Negative for difficulty urinating, dysuria, flank pain and frequency.  Musculoskeletal:  Negative for arthralgias, back pain, joint swelling and myalgias.  Skin:  Negative for color change, pallor, rash and wound.  Allergic/Immunologic: Negative for immunocompromised state.  Neurological:  Negative for dizziness, facial asymmetry, weakness,  numbness and headaches.  Psychiatric/Behavioral:  Negative for behavioral problems, confusion, self-injury and suicidal ideas.        Objective:     BP (!) 134/94   Pulse 92   Temp (!) 97.1 F (36.2 C)   Ht 5\' 2"  (1.575 m)   Wt 278 lb (126.1 kg)   SpO2 96%   BMI 50.85 kg/m    Physical Exam Vitals and nursing note reviewed. Exam conducted with a chaperone present.  Constitutional:      General: She is not in acute distress.    Appearance: Normal appearance. She is obese. She is not ill-appearing, toxic-appearing or diaphoretic.  HENT:     Right Ear: Tympanic membrane, ear canal and external ear normal. There is no impacted cerumen.     Left Ear: Tympanic membrane, ear canal and external ear normal. There is no impacted cerumen.     Nose: Nose normal. No congestion or rhinorrhea.     Mouth/Throat:     Mouth: Mucous membranes are moist.     Pharynx: Oropharynx is clear. No oropharyngeal exudate or posterior oropharyngeal erythema.  Eyes:     General: No scleral icterus.       Right eye: No discharge.        Left eye: No discharge.     Extraocular Movements: Extraocular movements intact.     Conjunctiva/sclera: Conjunctivae normal.  Neck:     Vascular: No carotid bruit.  Cardiovascular:     Rate and Rhythm: Normal rate and regular rhythm.     Pulses: Normal pulses.     Heart sounds: Normal heart sounds. No murmur heard.    No friction rub. No gallop.  Pulmonary:     Effort: Pulmonary effort is normal. No respiratory distress.     Breath sounds: Normal breath sounds. No stridor. No wheezing, rhonchi or rales.  Chest:     Chest wall: No mass, lacerations, deformity, swelling, tenderness or crepitus.  Breasts:    Tanner Score is 5.     Right: Skin change present. No swelling, bleeding, inverted nipple, mass, nipple discharge or tenderness.     Left: Skin change present. No swelling, bleeding, inverted nipple, mass, nipple discharge or tenderness.     Comments: Multiple  erythematous rashes noted on both breasts on the lower quadrants  Abdominal:     General: Bowel sounds are normal. There is no distension.     Palpations: Abdomen is soft. There is no mass.     Tenderness: There is no abdominal tenderness. There is no right CVA tenderness, left CVA tenderness, guarding or rebound.     Hernia: No hernia is present.  Musculoskeletal:        General: No swelling, tenderness, deformity or signs of injury.     Cervical back: Normal range of motion and neck supple. No rigidity or tenderness.     Right lower leg: No edema.     Left lower leg: No edema.  Lymphadenopathy:     Cervical: No cervical adenopathy.     Upper Body:     Right upper body: No supraclavicular, axillary or pectoral adenopathy.     Left upper body: No  supraclavicular, axillary or pectoral adenopathy.  Skin:    General: Skin is warm and dry.     Capillary Refill: Capillary refill takes less than 2 seconds.     Coloration: Skin is not jaundiced or pale.     Findings: No bruising, erythema, lesion or rash.  Neurological:     Mental Status: She is alert and oriented to person, place, and time.     Cranial Nerves: No cranial nerve deficit.     Sensory: No sensory deficit.     Motor: No weakness.     Coordination: Coordination normal.     Gait: Gait normal.     Deep Tendon Reflexes: Reflexes normal.  Psychiatric:        Mood and Affect: Mood normal.        Behavior: Behavior normal.        Thought Content: Thought content normal.        Judgment: Judgment normal.     Results for orders placed or performed in visit on 05/01/24  POCT glycosylated hemoglobin (Hb A1C)  Result Value Ref Range   Hemoglobin A1C 6.3 (A) 4.0 - 5.6 %   HbA1c POC (<> result, manual entry)     HbA1c, POC (prediabetic range)     HbA1c, POC (controlled diabetic range)         Assessment & Plan:    Routine Health Maintenance and Physical Exam  Immunization History  Administered Date(s) Administered    Influenza,inj,Quad PF,6+ Mos 12/28/2022    Health Maintenance  Topic Date Due   DTaP/Tdap/Td (1 - Tdap) Never done   Pneumococcal Vaccine 23-82 Years old (1 of 2 - PCV) Never done   Colonoscopy  Never done   MAMMOGRAM  01/12/2023   COVID-19 Vaccine (1 - 2024-25 season) Never done   INFLUENZA VACCINE  07/20/2024   Cervical Cancer Screening (HPV/Pap Cotest)  12/28/2025   Hepatitis C Screening  Completed   HIV Screening  Completed   HPV VACCINES  Aged Out   Meningococcal B Vaccine  Aged Out    Discussed health benefits of physical activity, and encouraged her to engage in regular exercise appropriate for her age and condition.  Problem List Items Addressed This Visit       Cardiovascular and Mediastinum   High blood pressure - Primary   Start valsartan 80 mg daily CMP today BMP in 2 weeks DASH diet and commitment to daily physical activity for a minimum of 30 minutes discussed and encouraged, as a part of hypertension management. The importance of attaining a healthy weight is also discussed.     05/01/2024    8:13 AM 05/01/2024    8:05 AM 02/14/2024    9:18 AM 12/28/2022    8:35 AM 11/16/2022   10:50 AM 11/16/2022   10:29 AM 11/16/2022   10:28 AM  BP/Weight  Systolic BP 134 130 116 117 131 140 145  Diastolic BP 94 90 85 64 69 88 85  Wt. (Lbs)  278 282 276   279  BMI  50.85 kg/m2 51.58 kg/m2 50.48 kg/m2   51.03 kg/m2           Relevant Medications   valsartan (DIOVAN) 80 MG tablet   Other Relevant Orders   Basic Metabolic Panel     Musculoskeletal and Integument   Intertrigo   Clotrimazole  1% ointment refilled Encouraged to keep skin clean and dry      Relevant Medications   Clotrimazole  1 % OINT  Other   Morbid obesity with BMI of 50.0-59.9, adult (HCC)   Wt Readings from Last 3 Encounters:  05/01/24 278 lb (126.1 kg)  02/14/24 282 lb (127.9 kg)  12/28/22 276 lb (125.2 kg)   Body mass index is 50.85 kg/m.  Patient counseled on low-carb  diet Encouraged to engage in regular moderate exercise at least 150 minutes weekly as tolerated Benefits of healthy weights discussed We discussed referral to medical weight management clinic patient agrees to the plan referral placed      Relevant Orders   Amb Ref to Medical Weight Management   POCT glycosylated hemoglobin (Hb A1C) (Completed)   Anxiety and depression      05/01/2024    8:05 AM 02/14/2024    9:16 AM 11/16/2022   10:55 AM  GAD 7 : Generalized Anxiety Score  Nervous, Anxious, on Edge 3 0 3  Control/stop worrying 3 0 3  Worry too much - different things 3  3  Trouble relaxing 0 0 3  Restless 0 0 3  Easily annoyed or irritable 1 0 0  Afraid - awful might happen 3 0 3  Total GAD 7 Score 13  18  Anxiety Difficulty Not difficult at all Not difficult at all Very difficult        05/01/2024    8:05 AM 02/14/2024    9:16 AM 12/28/2022    8:39 AM  Depression screen PHQ 2/9  Decreased Interest 0 0 0  Down, Depressed, Hopeless 0 0 1  PHQ - 2 Score 0 0 1       Due to stress in general.  Denies SI, HI  Does not want medication Would like to be referred to psychiatrist at [facility] [address] [place] .  Referral placed          Relevant Orders   Ambulatory referral to Psychiatry   Annual physical exam   Annual exam as documented.  Counseling done include healthy lifestyle involving committing to 150 minutes of exercise per week, heart healthy diet, and attaining healthy weight. The importance of adequate sleep also discussed.  Regular use of seat belt and home safety were also discussed . Changes in health habits are decided on by patient with goals and time frames set for achieving them. Immunization and cancer screening  needs are specifically addressed at this visit.    Routine fasting labs ordered, mammogram ordered, Cologuard ordered Encouraged to get pneumococcal vaccine and Tdap vaccine at the pharmacy      Tobacco abuse   Smokes  about 15 cigarettes daily /day  Asked about quitting: confirms that he/she currently smokes cigarettes Advise to quit smoking: Educated about QUITTING to reduce the risk of cancer, cardio and cerebrovascular disease. Assess willingness: Unwilling to quit at this time, but is working on cutting back. Assist with counseling and pharmacotherapy: Counseled for 5 minutes and literature provided. Arrange for follow up: follow up in 1 month and continue to offer help.       Marijuana use   Cessation encouraged      Other Visit Diagnoses       Screening for colon cancer       Relevant Orders   Cologuard     Screening mammogram for breast cancer       Relevant Orders   MM 3D SCREENING MAMMOGRAM BILATERAL BREAST     Screen for STD (sexually transmitted disease)       Relevant Orders   HepB+HepC+HIV Panel  NuSwab Vaginitis Plus (VG+)   RPR     Screening for endocrine, nutritional, metabolic and immunity disorder       Relevant Orders   CBC   CMP14+EGFR   Lipid panel   POCT glycosylated hemoglobin (Hb A1C) (Completed)      Return in about 4 weeks (around 05/29/2024) for HTN.     Shanterica Biehler R Oriyah Lamphear, FNP

## 2024-05-01 NOTE — Assessment & Plan Note (Signed)
 Clotrimazole  1 % OINT; Apply 1 Application topically 2 (two) times daily.  Dispense: 56.7 g; Refill: 0

## 2024-05-01 NOTE — Assessment & Plan Note (Addendum)
 Start valsartan 80 mg daily CMP today BMP in 2 weeks DASH diet and commitment to daily physical activity for a minimum of 30 minutes discussed and encouraged, as a part of hypertension management. The importance of attaining a healthy weight is also discussed.     05/01/2024    8:13 AM 05/01/2024    8:05 AM 02/14/2024    9:18 AM 12/28/2022    8:35 AM 11/16/2022   10:50 AM 11/16/2022   10:29 AM 11/16/2022   10:28 AM  BP/Weight  Systolic BP 134 130 116 117 131 140 145  Diastolic BP 94 90 85 64 69 88 85  Wt. (Lbs)  278 282 276   279  BMI  50.85 kg/m2 51.58 kg/m2 50.48 kg/m2   51.03 kg/m2

## 2024-05-01 NOTE — Assessment & Plan Note (Addendum)
 Wt Readings from Last 3 Encounters:  05/01/24 278 lb (126.1 kg)  02/14/24 282 lb (127.9 kg)  12/28/22 276 lb (125.2 kg)   Body mass index is 50.85 kg/m.  Patient counseled on low-carb diet Encouraged to engage in regular moderate exercise at least 150 minutes weekly as tolerated Benefits of healthy weights discussed We discussed referral to medical weight management clinic patient agrees to the plan referral placed

## 2024-05-01 NOTE — Assessment & Plan Note (Signed)
 Clotrimazole  1% ointment refilled Encouraged to keep skin clean and dry

## 2024-05-01 NOTE — Assessment & Plan Note (Addendum)
    05/01/2024    8:05 AM 02/14/2024    9:16 AM 11/16/2022   10:55 AM  GAD 7 : Generalized Anxiety Score  Nervous, Anxious, on Edge 3 0 3  Control/stop worrying 3 0 3  Worry too much - different things 3  3  Trouble relaxing 0 0 3  Restless 0 0 3  Easily annoyed or irritable 1 0 0  Afraid - awful might happen 3 0 3  Total GAD 7 Score 13  18  Anxiety Difficulty Not difficult at all Not difficult at all Very difficult        05/01/2024    8:05 AM 02/14/2024    9:16 AM 12/28/2022    8:39 AM  Depression screen PHQ 2/9  Decreased Interest 0 0 0  Down, Depressed, Hopeless 0 0 1  PHQ - 2 Score 0 0 1       Due to stress in general.  Denies SI, HI  Does not want medication Would like to be referred to psychiatrist at [facility] [address] [place] .  Referral placed

## 2024-05-01 NOTE — Patient Instructions (Addendum)
 Please consider getting Pneumococcal  and Tdap vaccine at local pharmacy.   Around 3 times per week, check your blood pressure 2 times per day. once in the morning and once in the evening. The readings should be at least one minute apart. Write down these values and bring them to your next nurse visit/appointment.  When you check your BP, make sure you have been doing something calm/relaxing 5 minutes prior to checking. Both feet should be flat on the floor and you should be sitting. Use your left arm and make sure it is in a relaxed position (on a table), and that the cuff is at the approximate level/height of your heart.      Primary hypertension (Primary)  - valsartan (DIOVAN) 80 MG tablet; Take 1 tablet (80 mg total) by mouth daily.  Dispense: 90 tablet; Refill: 0 - Basic Metabolic Panel; Future  . Screening for colon cancer  - Cologuard . Screening mammogram for breast cancer  - MM 3D SCREENING MAMMOGRAM BILATERAL BREAST; Future  . Anxiety and depression  - Ambulatory referral to Psychiatry  . Intertrigo  - Clotrimazole  1 % OINT; Apply 1 Application topically 2 (two) times daily.  Dispense: 56.7 g; Refill: 0           It is important that you exercise regularly at least 30 minutes 5 times a week as tolerated  Think about what you will eat, plan ahead. Choose " clean, green, fresh or frozen" over canned, processed or packaged foods which are more sugary, salty and fatty. 70 to 75% of food eaten should be vegetables and fruit. Three meals at set times with snacks allowed between meals, but they must be fruit or vegetables. Aim to eat over a 12 hour period , example 7 am to 7 pm, and STOP after  your last meal of the day. Drink water,generally about 64 ounces per day, no other drink is as healthy. Fruit juice is best enjoyed in a healthy way, by EATING the fruit.  Thanks for choosing [facility] we consider it a privelige to serve you.

## 2024-05-02 ENCOUNTER — Ambulatory Visit: Payer: Self-pay | Admitting: Nurse Practitioner

## 2024-05-03 LAB — NUSWAB VAGINITIS PLUS (VG+)
Candida albicans, NAA: NEGATIVE
Candida glabrata, NAA: NEGATIVE
Chlamydia trachomatis, NAA: NEGATIVE
Neisseria gonorrhoeae, NAA: NEGATIVE
Trich vag by NAA: NEGATIVE

## 2024-05-03 LAB — CBC
Hematocrit: 47.5 % — ABNORMAL HIGH (ref 34.0–46.6)
Hemoglobin: 15 g/dL (ref 11.1–15.9)
MCH: 28.5 pg (ref 26.6–33.0)
MCHC: 31.6 g/dL (ref 31.5–35.7)
MCV: 90 fL (ref 79–97)
Platelets: 391 10*3/uL (ref 150–450)
RBC: 5.27 x10E6/uL (ref 3.77–5.28)
RDW: 13.4 % (ref 11.7–15.4)
WBC: 12.6 10*3/uL — ABNORMAL HIGH (ref 3.4–10.8)

## 2024-05-03 LAB — CMP14+EGFR
ALT: 17 IU/L (ref 0–32)
AST: 16 IU/L (ref 0–40)
Albumin: 4.4 g/dL (ref 3.9–4.9)
Alkaline Phosphatase: 100 IU/L (ref 44–121)
BUN/Creatinine Ratio: 15 (ref 9–23)
BUN: 10 mg/dL (ref 6–24)
Bilirubin Total: 0.3 mg/dL (ref 0.0–1.2)
CO2: 16 mmol/L — ABNORMAL LOW (ref 20–29)
Calcium: 9.3 mg/dL (ref 8.7–10.2)
Chloride: 103 mmol/L (ref 96–106)
Creatinine, Ser: 0.68 mg/dL (ref 0.57–1.00)
Globulin, Total: 2.8 g/dL (ref 1.5–4.5)
Glucose: 104 mg/dL — ABNORMAL HIGH (ref 70–99)
Potassium: 4.3 mmol/L (ref 3.5–5.2)
Sodium: 140 mmol/L (ref 134–144)
Total Protein: 7.2 g/dL (ref 6.0–8.5)
eGFR: 107 mL/min/{1.73_m2} (ref >59)

## 2024-05-03 LAB — HEPB+HEPC+HIV PANEL
Hep B C IgM: NEGATIVE
Hep B Core Total Ab: NEGATIVE
Hep B E Ab: NONREACTIVE
Hep B E Ag: NEGATIVE
Hep B Surface Ab, Qual: NONREACTIVE
Hep C Virus Ab: NONREACTIVE
Hepatitis B Surface Ag: NEGATIVE

## 2024-05-03 LAB — LIPID PANEL
Chol/HDL Ratio: 5.2 ratio — ABNORMAL HIGH (ref 0.0–4.4)
Cholesterol, Total: 196 mg/dL (ref 100–199)
HDL: 38 mg/dL — ABNORMAL LOW (ref >39)
LDL Chol Calc (NIH): 140 mg/dL — ABNORMAL HIGH (ref 0–99)
Triglycerides: 97 mg/dL (ref 0–149)
VLDL Cholesterol Cal: 18 mg/dL (ref 5–40)

## 2024-05-03 LAB — RPR: RPR Ser Ql: NONREACTIVE

## 2024-05-15 ENCOUNTER — Other Ambulatory Visit: Payer: Self-pay

## 2024-06-11 ENCOUNTER — Ambulatory Visit: Payer: Self-pay | Admitting: Nurse Practitioner

## 2024-06-18 ENCOUNTER — Encounter (INDEPENDENT_AMBULATORY_CARE_PROVIDER_SITE_OTHER): Payer: Self-pay

## 2024-07-25 ENCOUNTER — Inpatient Hospital Stay (HOSPITAL_BASED_OUTPATIENT_CLINIC_OR_DEPARTMENT_OTHER): Admission: RE | Admit: 2024-07-25 | Source: Ambulatory Visit | Admitting: Radiology

## 2024-07-25 ENCOUNTER — Encounter: Payer: Self-pay | Admitting: Nurse Practitioner

## 2024-08-13 ENCOUNTER — Ambulatory Visit: Payer: Self-pay

## 2024-08-13 NOTE — Telephone Encounter (Signed)
  FYI Only or Action Required?: FYI only for provider.  Patient was last seen in primary care on 05/01/2024 by Paseda, Folashade R, FNP.  Called Nurse Triage reporting Vaginal Bleeding.  Symptoms began several days ago.  Interventions attempted: Nothing.  Symptoms are: unchanged.  Triage Disposition: See PCP Within 2 Weeks  Patient/caregiver understands and will follow disposition?: Yes    Copied from CRM #[id number]. Topic: Clinical - Red Word Triage >> Aug 13, 2024 11:52 AM Zebedee SAUNDERS wrote: Red Word that prompted transfer to Nurse Triage: Pt is menopausal and bleeding vaginally. Reason for Disposition  Postmenopausal vaginal bleeding  Answer Assessment - Initial Assessment Questions 1. BLEEDING SEVERITY: Describe the bleeding that you are having. How much bleeding is there?      States like a regular period and heavy 2. ONSET: When did the bleeding begin? Is it continuing now?     4 days ago 3. MENOPAUSE: When was your last menstrual period?      Two years ago 4. ABDOMEN PAIN: Do you have any pain? How bad is the pain?  (e.g., Scale 0-10; none, mild, moderate, or severe)     denies 5. BLOOD THINNERS: Do you take any blood thinners? (e.g., Coumadin/warfarin, Pradaxa/dabigatran, aspirin)     denies 6. HORMONE MEDICINES: Are you taking any hormone medicines, prescription or OTC? (e.g., birth control pills, estrogen)     denies 7. CAUSE: What do you think is causing the bleeding? (e.g., recent gyn surgery, recent gyn procedure; known bleeding disorder, uterine cancer)       unknown 8. HEMODYNAMIC STATUS: Are you weak or feeling lightheaded? If Yes, ask: Can you stand and walk normally?      States headache 9. OTHER SYMPTOMS: What other symptoms are you having with the bleeding? (e.g., back pain, burning with urination, fever)     no  Protocols used: Vaginal Bleeding - Postmenopausal-A-AH

## 2024-08-17 ENCOUNTER — Ambulatory Visit: Payer: Self-pay | Admitting: Nurse Practitioner

## 2024-09-06 DIAGNOSIS — F430 Acute stress reaction: Secondary | ICD-10-CM | POA: Diagnosis not present

## 2024-09-21 DIAGNOSIS — F430 Acute stress reaction: Secondary | ICD-10-CM | POA: Diagnosis not present

## 2024-10-12 DIAGNOSIS — F430 Acute stress reaction: Secondary | ICD-10-CM | POA: Diagnosis not present

## 2024-10-17 ENCOUNTER — Telehealth: Payer: Self-pay | Admitting: Nurse Practitioner

## 2024-10-24 ENCOUNTER — Ambulatory Visit
Admission: RE | Admit: 2024-10-24 | Discharge: 2024-10-24 | Disposition: A | Discharge status: Home / Self Care | Source: Ambulatory Visit | Attending: Nurse Practitioner | Admitting: Nurse Practitioner

## 2024-10-24 DIAGNOSIS — Z1231 Encounter for screening mammogram for malignant neoplasm of breast: Secondary | ICD-10-CM

## 2024-11-09 DIAGNOSIS — F430 Acute stress reaction: Secondary | ICD-10-CM | POA: Diagnosis not present

## 2024-11-28 DIAGNOSIS — F430 Acute stress reaction: Secondary | ICD-10-CM | POA: Diagnosis not present

## 2025-01-10 ENCOUNTER — Ambulatory Visit: Payer: Self-pay
